# Patient Record
Sex: Female | Born: 1995
Health system: Southern US, Community
[De-identification: ages and names within clinical notes are randomized; demographics above are authoritative.]

## PROBLEM LIST (undated history)

## (undated) DIAGNOSIS — E559 Vitamin D deficiency, unspecified: Secondary | ICD-10-CM

## (undated) HISTORY — DX: Vitamin D deficiency, unspecified: E55.9

---
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
## 2016-03-06 ENCOUNTER — Ambulatory Visit (INDEPENDENT_AMBULATORY_CARE_PROVIDER_SITE_OTHER): Payer: 59 | Admitting: Medical

## 2016-03-06 ENCOUNTER — Encounter: Payer: Self-pay | Admitting: Medical

## 2016-03-06 VITALS — BP 102/60 | HR 76 | Resp 18 | Wt 164.0 lb

## 2016-03-06 DIAGNOSIS — N898 Other specified noninflammatory disorders of vagina: Secondary | ICD-10-CM | POA: Diagnosis not present

## 2016-03-06 DIAGNOSIS — R109 Unspecified abdominal pain: Secondary | ICD-10-CM | POA: Diagnosis not present

## 2016-03-06 LAB — POCT URINALYSIS DIPSTICK
BILIRUBIN UA: NEGATIVE
Blood, UA: NEGATIVE
Glucose, UA: NEGATIVE
KETONES UA: NEGATIVE
LEUKOCYTES UA: NEGATIVE
Nitrite, UA: NEGATIVE
PH UA: 6
Protein, UA: NEGATIVE
Urobilinogen, UA: NEGATIVE

## 2016-03-06 LAB — POCT WET PREP (WET MOUNT)

## 2016-03-06 MED ORDER — FLUCONAZOLE 150 MG PO TABS: ORAL_TABLET | ORAL | 1 refills | Status: DC

## 2016-03-06 NOTE — Progress Notes (Signed)
Subjective: Chief Complaint  Patient presents with  . Vaginal Discharge    onset 1 week ago. reports thick, white discharge. Reports after having intercourse has a smell (fishy). Pt is having irritation.    Here for 1 wk hx/ o thick white vaginal discharge.  Has a fishy odor after intercourse.   Having some vaginal irritation.  She notes new sexual partner x 53mo but uses condoms always.  No recent antibiotics.  No change in hygiene products.   She does note some upper abdomian discomfort at times, in the mornings.   Not necessarily after fried foods/fatty foods.   Eats 1-2 meals daily, typically fruit or pop tart at breakfast.   Does eat fast food, drinks a fair amount of soda.  No much protein.  No recent fever, no urinary c/o, no bowel c/o, no diarrhea, no constipation, no blood in urine or stool.  No other aggravating or relieving factors. No other complaint.  Past Medical History:  Diagnosis Date  . Vitamin D deficiency    No current outpatient prescriptions on file prior to visit.   No current facility-administered medications on file prior to visit.    ROS as in subjective   Objective: BP 102/60   Pulse 76   Resp 18   Wt 164 lb (74.4 kg)   LMP 02/23/2016   BMI 30.00 kg/m   Wt Readings from Last 3 Encounters:  03/06/16 164 lb (74.4 kg) (90 %, Z= 1.27)*  11/17/15 159 lb (72.1 kg) (88 %, Z= 1.17)*  07/08/15 170 lb (77.1 kg) (93 %, Z= 1.46)*   * Growth percentiles are based on CDC 2-20 Years data.   Gen: wd, wn, nad Gyn: Normal external genitalia without lesions, vagina with normal mucosa, cervix without lesions, no cervical motion tenderness, no abnormal vaginal discharge.  Uterus and adnexa not enlarged, nontender, no masses.  Swabs taken.  Exam chaperoned by nurse.     Assessment: Encounter Diagnoses  Name Primary?  . Vaginal discharge Yes  . Abdominal discomfort      Plan Advised less sugar in diet, healthier diet, try and get protein/carb combo with meals.    If abdominal pain continues, then f/u  Vaginal discharge - wet prep + yeast.  Begin diflucan, f/u pending other labs.   C/t condom use, safe sex.   Miranda Schneider was seen today for vaginal discharge.  Diagnoses and all orders for this visit:  Vaginal discharge -     Urinalysis Dipstick -     GC/Chlamydia Probe Amp -     POCT Wet Prep [facility])  Abdominal discomfort  Other orders -     fluconazole (DIFLUCAN) 150 MG tablet; 1 tablet every other day

## 2016-03-07 LAB — GC/CHLAMYDIA PROBE AMP
CT Probe RNA: NOT DETECTED
GC Probe RNA: NOT DETECTED

## 2016-05-16 ENCOUNTER — Ambulatory Visit: Payer: 59 | Admitting: Medical

## 2016-05-18 ENCOUNTER — Encounter: Payer: Self-pay | Admitting: Medical

## 2016-06-23 ENCOUNTER — Ambulatory Visit (HOSPITAL_COMMUNITY)
Admission: EM | Admit: 2016-06-23 | Discharge: 2016-06-23 | Disposition: A | Discharge status: Other Acute Inpatient Hospital | Payer: Self-pay

## 2016-06-24 ENCOUNTER — Encounter (HOSPITAL_COMMUNITY): Payer: Self-pay | Admitting: Emergency Medicine

## 2016-06-24 ENCOUNTER — Ambulatory Visit (HOSPITAL_COMMUNITY)
Admission: EM | Admit: 2016-06-24 | Discharge: 2016-06-24 | Disposition: A | Discharge status: Home / Self Care | Payer: 59 | Attending: Internal Medicine | Admitting: Internal Medicine

## 2016-06-24 DIAGNOSIS — R05 Cough: Secondary | ICD-10-CM | POA: Insufficient documentation

## 2016-06-24 DIAGNOSIS — J029 Acute pharyngitis, unspecified: Secondary | ICD-10-CM | POA: Insufficient documentation

## 2016-06-24 DIAGNOSIS — E559 Vitamin D deficiency, unspecified: Secondary | ICD-10-CM | POA: Insufficient documentation

## 2016-06-24 DIAGNOSIS — R509 Fever, unspecified: Secondary | ICD-10-CM | POA: Insufficient documentation

## 2016-06-24 DIAGNOSIS — R6889 Other general symptoms and signs: Secondary | ICD-10-CM | POA: Diagnosis not present

## 2016-06-24 LAB — POCT RAPID STREP A: STREPTOCOCCUS, GROUP A SCREEN (DIRECT): NEGATIVE

## 2016-06-24 MED ORDER — OSELTAMIVIR PHOSPHATE 75 MG PO CAPS: 75.0000 mg | ORAL_CAPSULE | Freq: Two times a day (BID) | ORAL | 0 refills | Status: DC

## 2016-06-24 NOTE — Discharge Instructions (Signed)
Rest, tylenol for comfort.  Lots of fluids.  No return to school or work until 24 hours symptom free.

## 2016-06-24 NOTE — ED Provider Notes (Signed)
CSN: [id number]     Arrival date & time 06/24/16  1516 History   First MD Initiated Contact with Patient 06/24/16 1754     Chief Complaint  Patient presents with  . Sore Throat   (Consider location/radiation/quality/duration/timing/severity/associated sxs/prior Treatment)  HPI   The patient is a 21 year old female presenting today with complaints of generalized malaise with fever, cough, sore throat, headache, and muscle aching since Thursday evening/Friday morning.  Patient states she has a productive green mucus with cough. Patient reports positive fever and chills with some nausea but no vomiting. The patient states, "I ache all over".  Past Medical History:  Diagnosis Date  . Vitamin D deficiency    History reviewed. No pertinent surgical history. Family History  Problem Relation Age of Onset  . Heart disease Neg Hx   . Stroke Neg Hx   . Diabetes Neg Hx   . Cancer Neg Hx   . Hypertension Paternal Grandmother    Social History  Substance Use Topics  . Smoking status: Never Smoker  . Smokeless tobacco: Not on file  . Alcohol use No   OB History    No data available     Review of Systems  Constitutional: Positive for chills, fatigue and fever.  HENT: Positive for congestion and sore throat.   Eyes: Negative.  Negative for visual disturbance.  Respiratory: Positive for cough. Negative for shortness of breath.   Cardiovascular: Negative.  Negative for chest pain and leg swelling.  Gastrointestinal: Positive for nausea. Negative for diarrhea and vomiting.  Endocrine: Negative.   Genitourinary: Negative.   Musculoskeletal: Negative.  Negative for gait problem and neck stiffness.  Skin: Negative.  Negative for color change and rash.  Allergic/Immunologic: Positive for environmental allergies. Negative for food allergies and immunocompromised state.  Neurological: Positive for headaches. Negative for dizziness and weakness.  Hematological: Negative.    Psychiatric/Behavioral: Negative.     Allergies  Patient has no known allergies.  Home Medications   Prior to Admission medications   Medication Sig Start Date End Date Taking? Authorizing Provider  oseltamivir (TAMIFLU) 75 MG capsule Take 1 capsule (75 mg total) by mouth every 12 (twelve) hours. 06/24/16   Servando Salinaatherine H Ozelle Brubacher, NP   Meds Ordered and Administered this Visit  Medications - No data to display  BP 133/57 (BP Location: Right Arm)   Pulse 69   Temp 98.3 F (36.8 C) (Oral)   Resp 18   SpO2 98%  No data found.   Physical Exam  Constitutional: She is oriented to person, place, and time. She appears well-developed and well-nourished. No distress.  Eyes: Conjunctivae are normal. Pupils are equal, round, and reactive to light. Right eye exhibits no discharge. Left eye exhibits no discharge. No scleral icterus.  Neck: Normal range of motion. Neck supple. No thyromegaly present.  Needed for nuchal rigidity  Cardiovascular: Normal rate, regular rhythm, normal heart sounds and intact distal pulses.  Exam reveals no gallop and no friction rub.   No murmur heard. Pulmonary/Chest: Effort normal and breath sounds normal. No respiratory distress. She has no wheezes. She has no rales. She exhibits no tenderness.  Lymphadenopathy:    She has no cervical adenopathy.  Neurological: She is alert and oriented to person, place, and time.  Skin: Skin is warm and dry. No rash noted. She is not diaphoretic.  Nursing note and vitals reviewed.   Urgent Care Course     Procedures (including critical care time)  Labs Review Labs Reviewed  POCT RAPID STREP A    Imaging Review No results found.  Results for orders placed or performed during the hospital encounter of 06/24/16  POCT rapid strep A [facility])  Result Value Ref Range   Streptococcus, Group A Screen (Direct) NEGATIVE NEGATIVE   Is most likely a flulike illness. Patient requests work and school note.   MDM    1. Flu-like symptoms    Meds ordered this encounter  Medications  . oseltamivir (TAMIFLU) 75 MG capsule    Sig: Take 1 capsule (75 mg total) by mouth every 12 (twelve) hours.    Dispense:  10 capsule    Refill:  0   The usual and customary discharge instructions and warnings were given.  The patient verbalizes understanding and agrees to plan of care.      Servando Salina, NP 06/24/16 2010

## 2016-06-24 NOTE — ED Triage Notes (Signed)
The patient presented to the [facility] with a complaint of a sore throat x 4 days. The patient denied any fever at home. 

## 2016-06-27 LAB — CULTURE, GROUP A STREP (THRC)

## 2018-10-11 ENCOUNTER — Ambulatory Visit (HOSPITAL_COMMUNITY)
Admission: EM | Admit: 2018-10-11 | Discharge: 2018-10-11 | Disposition: A | Discharge status: Home / Self Care | Payer: 59 | Attending: Family Medicine | Admitting: Family Medicine

## 2018-10-11 ENCOUNTER — Other Ambulatory Visit: Payer: Self-pay

## 2018-10-11 DIAGNOSIS — N760 Acute vaginitis: Secondary | ICD-10-CM

## 2018-10-11 MED ORDER — FLUCONAZOLE 150 MG PO TABS: 150.0000 mg | ORAL_TABLET | Freq: Once | ORAL | 10 refills | Status: AC

## 2018-10-11 NOTE — ED Provider Notes (Signed)
[facility]    CSN: [id number] Arrival date & time: 10/11/18  1339     History   Chief Complaint Chief Complaint  Patient presents with  . Vaginal Itching    discharge    HPI Miranda Schneider is a 23 y.o. female.   Established [facility] patient with vaginal discharge following abx.  Per pt she has been on antibiotics for about 1 month for different things and now has itching and discharge and very irritated. Swollen in the vagina area. Very thick tissue like stuff coming out of her vagina.  She has a new partner and would like STD testing     Past Medical History:  Diagnosis Date  . Vitamin D deficiency     Patient Active Problem List   Diagnosis Date Noted  . Abdominal discomfort 03/06/2016  . Vaginal discharge 03/06/2016  . Shoulder subluxation, right 03/26/2011    No past surgical history on file.  OB History   No obstetric history on file.      Home Medications    Prior to Admission medications   Medication Sig Start Date End Date Taking? Authorizing Provider  fluconazole (DIFLUCAN) 150 MG tablet Take 1 tablet (150 mg total) by mouth once for 1 dose. Repeat if needed 10/11/18 10/11/18  Elvina Sidle, MD    Family History Family History  Problem Relation Age of Onset  . Heart disease Neg Hx   . Stroke Neg Hx   . Diabetes Neg Hx   . Cancer Neg Hx   . Hypertension Paternal Grandmother     Social History Social History   Tobacco Use  . Smoking status: Never Smoker  Substance Use Topics  . Alcohol use: No  . Drug use: No     Allergies   Patient has no known allergies.   Review of Systems Review of Systems   Physical Exam Triage Vital Signs ED Triage Vitals [10/11/18 1419]  Enc Vitals Group     BP      Pulse      Resp      Temp      Temp src      SpO2      Weight      Height      Head Circumference      Peak Flow      Pain Score 5     Pain Loc      Pain Edu?      Excl. in GC?    No data found.  Updated Vital  Signs BP 137/90 (BP Location: Right Arm)   Pulse 83   Temp 98.6 F (37 C) (Oral)   Resp 16   LMP 10/03/2018   SpO2 99%   Physical Exam Vitals signs and nursing note reviewed.  Constitutional:      Appearance: Normal appearance.  HENT:     Head: Normocephalic.  Eyes:     Conjunctiva/sclera: Conjunctivae normal.  Neck:     Musculoskeletal: Normal range of motion and neck supple.  Pulmonary:     Effort: Pulmonary effort is normal.  Musculoskeletal: Normal range of motion.  Skin:    General: Skin is warm and dry.  Neurological:     General: No focal deficit present.     Mental Status: She is alert.  Psychiatric:        Mood and Affect: Mood normal.        Thought Content: Thought content normal.  Judgment: Judgment normal.      UC Treatments / Results  Labs (all labs ordered are listed, but only abnormal results are displayed) Labs Reviewed  URINE CYTOLOGY ANCILLARY ONLY    EKG None  Radiology No results found.  Procedures Procedures (including critical care time)  Medications Ordered in UC Medications - No data to display  Initial Impression / Assessment and Plan / UC Course  I have reviewed the triage vital signs and the nursing notes.  Pertinent labs & imaging results that were available during my care of the patient were reviewed by me and considered in my medical decision making (see chart for details).    Final Clinical Impressions(s) / UC Diagnoses   Final diagnoses:  Vaginitis and vulvovaginitis   Discharge Instructions   None    ED Prescriptions    Medication Sig Dispense Auth. Provider   fluconazole (DIFLUCAN) 150 MG tablet Take 1 tablet (150 mg total) by mouth once for 1 dose. Repeat if needed 2 tablet Elvina SidleLauenstein, Christionna Poland, MD     Controlled Substance Prescriptions [place] Controlled Substance Registry consulted? Not Applicable   Elvina SidleLauenstein, Nomie Buchberger, MD 10/11/18 [phone]

## 2018-10-11 NOTE — ED Triage Notes (Signed)
Per pt she has been on antibiotics for about 1 month for different things and now has itching and discharge and very irritated. Swollen in the vagina area. Very thick tissue like stuff coming out of her vagina.

## 2018-10-13 LAB — URINE CYTOLOGY ANCILLARY ONLY
Chlamydia: NEGATIVE
Neisseria Gonorrhea: NEGATIVE
Trichomonas: NEGATIVE

## 2018-10-14 LAB — URINE CYTOLOGY ANCILLARY ONLY: Candida vaginitis: POSITIVE — AB

## 2018-10-20 ENCOUNTER — Telehealth (HOSPITAL_COMMUNITY): Payer: Self-pay | Admitting: Emergency Medicine

## 2018-10-20 MED ORDER — METRONIDAZOLE 500 MG PO TABS: 500.0000 mg | ORAL_TABLET | Freq: Two times a day (BID) | ORAL | 0 refills | Status: AC

## 2018-10-20 NOTE — Telephone Encounter (Signed)
Candida (yeast) is positive.  Prescription for fluconazole was given at the urgent care visit.    Pt states she is still having symptoms, requesting something for BV. Okay to send per Dr. Mannie Stabile, pt will need to be reseen if symptoms are still present after finishing medicines. Pt agreeable to plan, all questions answered.

## 2018-12-02 ENCOUNTER — Ambulatory Visit (INDEPENDENT_AMBULATORY_CARE_PROVIDER_SITE_OTHER)
Admission: RE | Admit: 2018-12-02 | Discharge: 2018-12-02 | Disposition: A | Discharge status: Home / Self Care | Payer: 59 | Source: Ambulatory Visit

## 2018-12-02 ENCOUNTER — Telehealth: Payer: 59

## 2018-12-02 ENCOUNTER — Ambulatory Visit
Admission: RE | Admit: 2018-12-02 | Discharge: 2018-12-02 | Discharge status: Against Medical Advice | Payer: 59 | Source: Ambulatory Visit

## 2018-12-02 DIAGNOSIS — N898 Other specified noninflammatory disorders of vagina: Secondary | ICD-10-CM | POA: Diagnosis not present

## 2018-12-02 MED ORDER — FLUCONAZOLE 200 MG PO TABS: ORAL_TABLET | ORAL | 0 refills | Status: DC

## 2018-12-02 MED ORDER — METRONIDAZOLE 500 MG PO TABS: 500.0000 mg | ORAL_TABLET | Freq: Two times a day (BID) | ORAL | 0 refills | Status: DC

## 2018-12-02 NOTE — Discharge Instructions (Signed)
Prescribed metronidazole 500 mg twice daily for 7 days (do not take while consuming alcohol and/or if breastfeeding) °Prescribed diflucan 200 mg once daily and then second dose 72 hours later °Take medications as prescribed and to completion °Follow up in person or with PCP if symptoms persists °Follow up in person or go to ER if you have any new or worsening symptoms fever, chills, nausea, vomiting, abdominal or pelvic pain, painful intercourse, vaginal discharge, vaginal bleeding, persistent symptoms despite treatment, etc... °

## 2018-12-02 NOTE — ED Provider Notes (Signed)
[facility] Virtual Visit via Video Note:  Miranda Schneider  initiated request for Telemedicine visit with [facility]gent [facility] team. I connected with Miranda Schneider  on 12/02/2018 at 11:33 AM  for a synchronized telemedicine visit using a video enabled HIPPA compliant telemedicine application. I verified that I am speaking with Miranda Schneider  using two identifiers. Miranda Box, PA-C  was physically located in a [facility] Urgent care site and Miranda Schneider was located at a different location.   The limitations of evaluation and management by telemedicine as well as the availability of in-person appointments were discussed. Patient was informed that she  may incur a bill ( including co-pay) for this virtual visit encounter. Miranda Schneider  expressed understanding and gave verbal consent to proceed with virtual visit.  [id number] 12/02/18 Arrival Time: 1120   [id number] DISCHARGE  SUBJECTIVE:  Miranda Schneider is a 23 y.o. female who presents with complaints of vaginal discharge and odor that began 2-3 days ago.  States partner ejaculated during intercourse prior to symptoms.  Patient is sexually active with 1 female partner over the past 6 months.  Describes discharge as milky and between a thick and thin consistency.  She has tried OTC medications without relief.  She reports reoccurring symptoms when her partner ejaculates during intercourse.  She reports similar symptoms in the past and was diagnosed with BV.  Complains of slight itching.  She denies fever, chills, nausea, vomiting, abdominal or pelvic pain, urinary symptoms, vaginal bleeding, dyspareunia, vaginal rashes or lesions.   LMP 11/07/2018 Current birth control method: None  ROS: As per HPI.  All other pertinent ROS negative.     Past Medical History:  Diagnosis Date  . Vitamin D deficiency    No past surgical history on file. No Known Allergies No current facility-administered medications on file prior  to encounter.    No current outpatient medications on file prior to encounter.     OBJECTIVE:  There were no vitals filed for this visit.  General appearance: alert; no distress Eyes: EOMI grossly HENT: normocephalic; atraumatic Neck: supple with FROM Lungs: normal respiratory effort; speaking in full sentences without difficulty Extremities: moves extremities without difficulty Skin: No obvious rashes Neurologic: No facial asymmetries Psychological: alert and cooperative; normal mood and affect  ASSESSMENT & PLAN:  1. Vaginal discharge   2. Vaginal odor     Meds ordered this encounter  Medications  . metroNIDAZOLE (FLAGYL) 500 MG tablet    Sig: Take 1 tablet (500 mg total) by mouth 2 (two) times daily.    Dispense:  14 tablet    Refill:  0    Order Specific Question:   Supervising Provider    Answer:   Raylene Everts [[id number]]  . fluconazole (DIFLUCAN) 200 MG tablet    Sig: Take one dose by mouth, wait 72 hours, and then take second dose by mouth    Dispense:  2 tablet    Refill:  0    Order Specific Question:   Supervising Provider    Answer:   Raylene Everts [[id number]]   Prescribed metronidazole 500 mg twice daily for 7 days (do not take while consuming alcohol and/or if breastfeeding) Prescribed diflucan 200 mg once daily and then second dose 72 hours later Take medications as prescribed and to completion Follow up in person or with PCP if symptoms persists Follow up in person or go to ER if you have any new or  worsening symptoms fever, chills, nausea, vomiting, abdominal or pelvic pain, painful intercourse, vaginal discharge, vaginal bleeding, persistent symptoms despite treatment, etc...  I discussed the assessment and treatment plan with the patient. The patient was provided an opportunity to ask questions and all were answered. The patient agreed with the plan and demonstrated an understanding of the instructions.   The patient was advised to call back or  seek an in-person evaluation if the symptoms worsen or if the condition fails to improve as anticipated.  I provided 15 minutes of non-face-to-face time during this encounter.  Rennis HardingBrittany Julias Mould, PA-C  12/02/2018 11:33 AM       Rennis HardingWurst, Jeremy Ditullio, PA-C 12/02/18 1137

## 2018-12-15 ENCOUNTER — Ambulatory Visit (INDEPENDENT_AMBULATORY_CARE_PROVIDER_SITE_OTHER)
Admission: RE | Admit: 2018-12-15 | Discharge: 2018-12-15 | Disposition: A | Discharge status: Home / Self Care | Payer: 59 | Source: Ambulatory Visit

## 2018-12-15 DIAGNOSIS — R110 Nausea: Secondary | ICD-10-CM

## 2018-12-15 DIAGNOSIS — R1010 Upper abdominal pain, unspecified: Secondary | ICD-10-CM | POA: Diagnosis not present

## 2018-12-15 MED ORDER — OMEPRAZOLE 20 MG PO CPDR: 20.0000 mg | DELAYED_RELEASE_CAPSULE | Freq: Every day | ORAL | 0 refills | Status: DC

## 2018-12-15 NOTE — ED Provider Notes (Signed)
Virtual Visit via Video Note:  Miranda Schneider  initiated request for Telemedicine visit with [facility] Urgent Care team. I connected with Miranda Schneider  on 12/15/2018 at 4:28 PM  for a synchronized telemedicine visit using a video enabled HIPPA compliant telemedicine application. I verified that I am speaking with Miranda Schneider  using two identifiers. Zigmund Gottron, NP  was physically located in a [facility] Urgent care site and Miranda Schneider was located at a different location.   The limitations of evaluation and management by telemedicine as well as the availability of in-person appointments were discussed. Patient was informed that she  may incur a bill ( including co-pay) for this virtual visit encounter. Miranda Schneider  expressed understanding and gave verbal consent to proceed with virtual visit.     History of Present Illness:Miranda Schneider  is a 23 y.o. female presents with complaints of upper abdominal pain and nausea. No vomiting. Has had this for the past two weeks. All the way across the upper abdomen. Nothing seems to worsen the symptoms. Laying down and when she wakes the pain is worse. Uncomfortable, not sharp or cramping. No increased pain with eating. Denies any previous evaluation for this. No fevers. Denies constipation. Regular bm's. No palpitations, heartracing etc. No shortness of breath or cough. Pain doesn't radiate up to throat or chest. A few days ago she woke up and had a burp in which some emesis came up and it felt burning. Hasn't taken any medications for symptoms. No previous abdominal surgeries. She is still taking antibiotics for vaginitis, but symptoms have improved.   Past Medical History:  Diagnosis Date  . Vitamin D deficiency     No Known Allergies      Observations/Objective: Alert, oriented, non toxic in appearance. Clear coherent speech without difficulty. No increased work of breathing visualized.  Indicates generalized upper abdominal  pain as location of pain, all the way across  Assessment and Plan: gerd vs cholecystitis vs pud vs gastritis considered. gerd treatment and lifestyle modifications recommended initially with in person visit recommended if no improvement. Return precautions provided. Patient verbalized understanding and agreeable to plan.    Follow Up Instructions:    I discussed the assessment and treatment plan with the patient. The patient was provided an opportunity to ask questions and all were answered. The patient agreed with the plan and demonstrated an understanding of the instructions.   The patient was advised to call back or seek an in-person evaluation if the symptoms worsen or if the condition fails to improve as anticipated.  I provided 13 minutes of non-face-to-face time during this encounter.    Zigmund Gottron, NP  12/15/2018 4:28 PM         Zigmund Gottron, NP 12/15/18 1630

## 2018-12-15 NOTE — Discharge Instructions (Signed)
See provided information about lifestyle modifications and diet modifications you can try to help with your symptoms.  Start daily omeprazole.  If no improvement in the next month please follow up with your primary care provider.

## 2019-06-02 ENCOUNTER — Ambulatory Visit: Payer: 59 | Attending: Internal Medicine

## 2019-06-02 DIAGNOSIS — Z20822 Contact with and (suspected) exposure to covid-19: Secondary | ICD-10-CM

## 2019-06-03 ENCOUNTER — Other Ambulatory Visit: Payer: 59

## 2019-06-03 LAB — NOVEL CORONAVIRUS, NAA: SARS-CoV-2, NAA: NOT DETECTED

## 2019-06-08 ENCOUNTER — Ambulatory Visit: Payer: 59 | Attending: Internal Medicine

## 2019-06-08 DIAGNOSIS — Z20822 Contact with and (suspected) exposure to covid-19: Secondary | ICD-10-CM

## 2019-06-09 LAB — NOVEL CORONAVIRUS, NAA: SARS-CoV-2, NAA: NOT DETECTED

## 2019-07-01 ENCOUNTER — Encounter (HOSPITAL_COMMUNITY): Payer: Self-pay

## 2019-07-01 ENCOUNTER — Ambulatory Visit (HOSPITAL_COMMUNITY)
Admission: EM | Admit: 2019-07-01 | Discharge: 2019-07-01 | Disposition: A | Discharge status: Home / Self Care | Payer: 59

## 2019-07-01 ENCOUNTER — Other Ambulatory Visit: Payer: Self-pay

## 2019-07-01 DIAGNOSIS — Z3A15 15 weeks gestation of pregnancy: Secondary | ICD-10-CM

## 2019-07-01 DIAGNOSIS — N898 Other specified noninflammatory disorders of vagina: Secondary | ICD-10-CM

## 2019-07-01 DIAGNOSIS — Z8619 Personal history of other infectious and parasitic diseases: Secondary | ICD-10-CM

## 2019-07-01 LAB — POCT URINALYSIS DIP (DEVICE)
Bilirubin Urine: NEGATIVE
Glucose, UA: NEGATIVE mg/dL
Hgb urine dipstick: NEGATIVE
Ketones, ur: NEGATIVE mg/dL
Nitrite: NEGATIVE
Protein, ur: NEGATIVE mg/dL
Specific Gravity, Urine: >=1.03 (ref 1.005–1.030)
Urobilinogen, UA: 0.2 mg/dL (ref 0.0–1.0)
pH: 6.5 (ref 5.0–8.0)

## 2019-07-01 MED ORDER — TERCONAZOLE 0.8 % VA CREA: 1.0000 | TOPICAL_CREAM | Freq: Every day | VAGINAL | 0 refills | Status: DC

## 2019-07-01 NOTE — ED Triage Notes (Addendum)
Pt presents to UC with white vaginal discharge x 12 days. Pt states she started having white vaginal discharged after been treat it for Chlamydia and Gonorrhea. Pt states she is [redacted] weeks pregnant.

## 2019-07-01 NOTE — ED Provider Notes (Signed)
  [facility]   MRN: [id number] DOB: 11/08/1995  Subjective:   Miranda Schneider is a 24 y.o. female presenting for 12-day history of persistent vaginal discharge.  She has also had some intermittent swelling and irritation.  Of note, patient did get treated for chlamydia and gonorrhea 12 days ago.  Has a history of yeast infections when she takes antibiotics.  She is [redacted] weeks pregnant.  Takes prenatal vitamins.  No Known Allergies  Past Medical History:  Diagnosis Date  . Vitamin D deficiency      History reviewed. No pertinent surgical history.  Family History  Problem Relation Age of Onset  . Hypertension Paternal Grandmother   . Heart disease Neg Hx   . Stroke Neg Hx   . Diabetes Neg Hx   . Cancer Neg Hx     Social History   Tobacco Use  . Smoking status: Never Smoker  . Smokeless tobacco: Never Used  Substance Use Topics  . Alcohol use: No  . Drug use: No    ROS Denies fever, genital rash, abdominal pain, pelvic pain.  Objective:   Vitals: BP 108/69 (BP Location: Right Arm)   Pulse 87   Temp 98.5 F (36.9 C) (Oral)   Resp 17   LMP 10/03/2018   SpO2 100%   Physical Exam Constitutional:      General: She is not in acute distress.    Appearance: Normal appearance. She is well-developed. She is not ill-appearing, toxic-appearing or diaphoretic.  HENT:     Head: Normocephalic and atraumatic.     Nose: Nose normal.     Mouth/Throat:     Mouth: Mucous membranes are moist.     Pharynx: Oropharynx is clear.  Eyes:     General: No scleral icterus.    Extraocular Movements: Extraocular movements intact.     Pupils: Pupils are equal, round, and reactive to light.  Cardiovascular:     Rate and Rhythm: Normal rate.  Pulmonary:     Effort: Pulmonary effort is normal.  Skin:    General: Skin is warm and dry.  Neurological:     General: No focal deficit present.     Mental Status: She is alert and oriented to person, place, and time.   Psychiatric:        Mood and Affect: Mood normal.        Behavior: Behavior normal.        Thought Content: Thought content normal.        Judgment: Judgment normal.      Assessment and Plan :   1. Vaginal discharge   2. [redacted] weeks gestation of pregnancy   3. History of chlamydia   4. History of gonorrhea     Start topical terconazole to treat empirically for yeast vaginitis in pregnancy.  Labs pending for recheck of gonorrhea, chlamydia and also BV and yeast.  Keep close follow-up with obstetrician. Counseled patient on potential for adverse effects with medications prescribed/recommended today, ER and return-to-clinic precautions discussed, patient verbalized understanding.    Wallis Bamberg, [place] 07/01/19 [phone]

## 2019-07-03 LAB — CERVICOVAGINAL ANCILLARY ONLY
Bacterial vaginitis: NEGATIVE
Candida vaginitis: POSITIVE — AB
Chlamydia: NEGATIVE
Neisseria Gonorrhea: NEGATIVE
Trichomonas: NEGATIVE

## 2019-07-09 DIAGNOSIS — F17200 Nicotine dependence, unspecified, uncomplicated: Secondary | ICD-10-CM | POA: Insufficient documentation

## 2020-03-04 ENCOUNTER — Ambulatory Visit
Admission: EM | Admit: 2020-03-04 | Discharge: 2020-03-04 | Disposition: A | Discharge status: Home / Self Care | Payer: 59 | Attending: Emergency Medicine | Admitting: Emergency Medicine

## 2020-03-04 DIAGNOSIS — B373 Candidiasis of vulva and vagina: Secondary | ICD-10-CM | POA: Insufficient documentation

## 2020-03-04 DIAGNOSIS — B3731 Acute candidiasis of vulva and vagina: Secondary | ICD-10-CM

## 2020-03-04 LAB — POCT URINE PREGNANCY: Preg Test, Ur: NEGATIVE

## 2020-03-04 MED ORDER — FLUCONAZOLE 150 MG PO TABS: 150.0000 mg | ORAL_TABLET | Freq: Every day | ORAL | 0 refills | Status: DC

## 2020-03-04 NOTE — Discharge Instructions (Addendum)
Today you received treatment for yeast. Testing for chlamydia, gonorrhea, trichomonas is pending: please look for these results on the MyChart app/website.  We will notify you if you are positive and outline treatment at that time.  Important to avoid all forms of sexual intercourse (oral, vaginal, anal) with any/all partners for the next 7 days to avoid spreading/reinfecting. Any/all sexual partners should be notified of testing/treatment today.  Return for persistent/worsening symptoms or if you develop fever, abdominal or pelvic pain, discharge, genital pain, blood in your urine, or are re-exposed to an STI. 

## 2020-03-04 NOTE — ED Provider Notes (Signed)
[name]    CSN: [id number] Arrival date & time: 03/04/20  1041      History   Chief Complaint Chief Complaint  Patient presents with  . Vaginal Discharge    HPI Miranda Schneider is a 24 y.o. female  Presenting for vaginal discharge intermittently for the last week or so. States that she was treated for BV, which she has a history of, 9/27. Did not take medications appropriately due to misplacing them for a few days. States that she developed thick, white discharge with vaginal pruritus shortly thereafter. Endorsing history of vaginal Candida s/p antibiotic use. Currently sexual active with 1 female partner, not routinely using condoms. Does have a newborn at home: Not breast-feeding. Denies urinary frequency, urgency, burning, hematuria. No abdominal, pelvic or back pain, fever.  Past Medical History:  Diagnosis Date  . Vitamin D deficiency     Patient Active Problem List   Diagnosis Date Noted  . Abdominal discomfort 03/06/2016  . Vaginal discharge 03/06/2016  . Shoulder subluxation, right 03/26/2011    History reviewed. No pertinent surgical history.  OB History    Gravida  1   Para      Term      Preterm      AB      Living        SAB      TAB      Ectopic      Multiple      Live Births               Home Medications    Prior to Admission medications   Medication Sig Start Date End Date Taking? Authorizing Provider  fluconazole (DIFLUCAN) 150 MG tablet Take 1 tablet (150 mg total) by mouth daily. May repeat in 72 hours if needed 03/04/20   Hall-Potvin, [place], PA-C  omeprazole (PRILOSEC) 20 MG capsule Take 1 capsule (20 mg total) by mouth daily. 12/15/18 07/01/19  Georgetta Haber, NP    Family History Family History  Problem Relation Age of Onset  . Hypertension Paternal Grandmother   . Heart disease Neg Hx   . Stroke Neg Hx   . Diabetes Neg Hx   . Cancer Neg Hx     Social History Social History   Tobacco Use  .  Smoking status: Never Smoker  . Smokeless tobacco: Never Used  Substance Use Topics  . Alcohol use: No  . Drug use: No     Allergies   Patient has no known allergies.   Review of Systems As per HPI   Physical Exam Triage Vital Signs ED Triage Vitals  Enc Vitals Group     BP      Pulse      Resp      Temp      Temp src      SpO2      Weight      Height      Head Circumference      Peak Flow      Pain Score      Pain Loc      Pain Edu?      Excl. in GC?    No data found.  Updated Vital Signs BP (!) 141/92 (BP Location: Left Arm)   Pulse 77   Temp 98.7 F (37.1 C) (Oral)   Resp 18   LMP 02/02/2020   SpO2 96%   Breastfeeding No   Visual Acuity Right Eye Distance:  Left Eye Distance:   Bilateral Distance:    Right Eye Near:   Left Eye Near:    Bilateral Near:     Physical Exam Constitutional:      General: She is not in acute distress. HENT:     Head: Normocephalic and atraumatic.  Eyes:     General: No scleral icterus.    Pupils: Pupils are equal, round, and reactive to light.  Cardiovascular:     Rate and Rhythm: Normal rate.  Pulmonary:     Effort: Pulmonary effort is normal.  Abdominal:     General: Bowel sounds are normal.     Palpations: Abdomen is soft.     Tenderness: There is no abdominal tenderness. There is no right CVA tenderness, left CVA tenderness or guarding.  Genitourinary:    Comments: Patient declined, self-swab performed Skin:    Coloration: Skin is not jaundiced or pale.  Neurological:     Mental Status: She is alert and oriented to person, place, and time.      UC Treatments / Results  Labs (all labs ordered are listed, but only abnormal results are displayed) Labs Reviewed  POCT URINE PREGNANCY  CERVICOVAGINAL ANCILLARY ONLY    EKG   Radiology No results found.  Procedures Procedures (including critical care time)  Medications Ordered in UC Medications - No data to display  Initial Impression /  Assessment and Plan / UC Course  I have reviewed the triage vital signs and the nursing notes.  Pertinent labs & imaging results that were available during my care of the patient were reviewed by me and considered in my medical decision making (see chart for details).     Urine pregnancy negative. Cytology pending: We will treat empirically for vaginal Candida. Return precautions discussed, pt verbalized understanding and is agreeable to plan. Final Clinical Impressions(s) / UC Diagnoses   Final diagnoses:  Yeast vaginitis     Discharge Instructions     Today you received treatment for yeast. Testing for chlamydia, gonorrhea, trichomonas is pending: please look for these results on the MyChart app/website.  We will notify you if you are positive and outline treatment at that time.  Important to avoid all forms of sexual intercourse (oral, vaginal, anal) with any/all partners for the next 7 days to avoid spreading/reinfecting. Any/all sexual partners should be notified of testing/treatment today.  Return for persistent/worsening symptoms or if you develop fever, abdominal or pelvic pain, discharge, genital pain, blood in your urine, or are re-exposed to an STI.    ED Prescriptions    Medication Sig Dispense Auth. Provider   fluconazole (DIFLUCAN) 150 MG tablet Take 1 tablet (150 mg total) by mouth daily. May repeat in 72 hours if needed 2 tablet Hall-Potvin, [place], PA-C     PDMP not reviewed this encounter.   Hall-Potvin, [place], [place] 03/04/20 1105

## 2020-03-04 NOTE — ED Triage Notes (Addendum)
Pt states tx'd for a yeast infection 9/27 and didn't complete medication. States having white discharge with vaginal irritation, and burning/pain on urination at times. States wants STD testing and a pregnancy test.

## 2020-03-07 LAB — CERVICOVAGINAL ANCILLARY ONLY
Bacterial Vaginitis (gardnerella): NEGATIVE
Chlamydia: NEGATIVE
Comment: NEGATIVE
Comment: NEGATIVE
Comment: NEGATIVE
Comment: NORMAL
Neisseria Gonorrhea: NEGATIVE
Trichomonas: NEGATIVE

## 2020-04-08 ENCOUNTER — Other Ambulatory Visit: Payer: Self-pay

## 2020-04-08 ENCOUNTER — Ambulatory Visit
Admission: EM | Admit: 2020-04-08 | Discharge: 2020-04-08 | Disposition: A | Discharge status: Home / Self Care | Payer: 59 | Attending: Emergency Medicine | Admitting: Emergency Medicine

## 2020-04-08 DIAGNOSIS — N898 Other specified noninflammatory disorders of vagina: Secondary | ICD-10-CM | POA: Insufficient documentation

## 2020-04-08 LAB — POCT URINALYSIS DIP (MANUAL ENTRY)
Bilirubin, UA: NEGATIVE
Blood, UA: NEGATIVE
Glucose, UA: NEGATIVE mg/dL
Ketones, POC UA: NEGATIVE mg/dL
Leukocytes, UA: NEGATIVE
Nitrite, UA: NEGATIVE
Protein Ur, POC: NEGATIVE mg/dL
Spec Grav, UA: 1.025 (ref 1.010–1.025)
Urobilinogen, UA: 0.2 E.U./dL
pH, UA: 7 (ref 5.0–8.0)

## 2020-04-08 LAB — POCT URINE PREGNANCY: Preg Test, Ur: NEGATIVE

## 2020-04-08 NOTE — ED Triage Notes (Signed)
Pt present urinary frequency with a lot of pressure, abdominal pain and vaginal discharge. Symptoms started a week ago. Pt states she would like to be checked for an STD and pregnancy test.

## 2020-04-08 NOTE — ED Provider Notes (Signed)
[name]    CSN: [id number] Arrival date & time: 04/08/20  1041      History   Chief Complaint Chief Complaint  Patient presents with  . Urinary Tract Infection  . Abdominal Pain    HPI Miranda Schneider is a 24 y.o. female  Presenting for weeklong course of urinary frequency, suprapubic pressure, vaginal discharge.  Denies vaginal bleeding, pruritus, dysuria.  Denies polydipsia, polyphagia, back pain or fever.  Requesting STD and pregnancy testing.  States she has had a new partner since her last check which was negative.  Past Medical History:  Diagnosis Date  . Vitamin D deficiency     Patient Active Problem List   Diagnosis Date Noted  . Abdominal discomfort 03/06/2016  . Vaginal discharge 03/06/2016  . Shoulder subluxation, right 03/26/2011    History reviewed. No pertinent surgical history.  OB History    Gravida  1   Para      Term      Preterm      AB      Living        SAB      TAB      Ectopic      Multiple      Live Births               Home Medications    Prior to Admission medications   Medication Sig Start Date End Date Taking? Authorizing Provider  omeprazole (PRILOSEC) 20 MG capsule Take 1 capsule (20 mg total) by mouth daily. 12/15/18 07/01/19  Georgetta Haber, NP    Family History Family History  Problem Relation Age of Onset  . Hypertension Paternal Grandmother   . Heart disease Neg Hx   . Stroke Neg Hx   . Diabetes Neg Hx   . Cancer Neg Hx     Social History Social History   Tobacco Use  . Smoking status: Never Smoker  . Smokeless tobacco: Never Used  Substance Use Topics  . Alcohol use: No  . Drug use: No     Allergies   Patient has no known allergies.   Review of Systems Review of Systems  Constitutional: Negative for fatigue and fever.  Respiratory: Negative for cough and shortness of breath.   Cardiovascular: Negative for chest pain and palpitations.  Gastrointestinal:  Positive for abdominal pain. Negative for constipation and diarrhea.  Genitourinary: Positive for frequency and vaginal discharge. Negative for dysuria, flank pain, hematuria, pelvic pain, urgency, vaginal bleeding and vaginal pain.     Physical Exam Triage Vital Signs ED Triage Vitals  Enc Vitals Group     BP 04/08/20 1050 (!) 113/51     Pulse Rate 04/08/20 1050 82     Resp 04/08/20 1050 16     Temp 04/08/20 1050 97.9 F (36.6 C)     Temp Source 04/08/20 1050 Oral     SpO2 04/08/20 1050 97 %     Weight --      Height --      Head Circumference --      Peak Flow --      Pain Score 04/08/20 1051 0     Pain Loc --      Pain Edu? --      Excl. in GC? --    No data found.  Updated Vital Signs BP (!) 113/51 (BP Location: Left Arm)   Pulse 82   Temp 97.9 F (36.6 C) (Oral)   Resp  16   LMP 03/07/2020   SpO2 97%   Visual Acuity Right Eye Distance:   Left Eye Distance:   Bilateral Distance:    Right Eye Near:   Left Eye Near:    Bilateral Near:     Physical Exam Constitutional:      General: She is not in acute distress. HENT:     Head: Normocephalic and atraumatic.  Eyes:     General: No scleral icterus.    Pupils: Pupils are equal, round, and reactive to light.  Cardiovascular:     Rate and Rhythm: Normal rate.  Pulmonary:     Effort: Pulmonary effort is normal.  Abdominal:     General: Bowel sounds are normal.     Palpations: Abdomen is soft.     Tenderness: There is no abdominal tenderness. There is no right CVA tenderness, left CVA tenderness or guarding.  Genitourinary:    Comments: Patient declined, self-swab performed Skin:    Coloration: Skin is not jaundiced or pale.  Neurological:     Mental Status: She is alert and oriented to person, place, and time.      UC Treatments / Results  Labs (all labs ordered are listed, but only abnormal results are displayed) Labs Reviewed  POCT URINALYSIS DIP (MANUAL ENTRY) - Abnormal; Notable for the  following components:      Result Value   Clarity, UA hazy (*)    All other components within normal limits  POCT URINE PREGNANCY  CERVICOVAGINAL ANCILLARY ONLY    EKG   Radiology No results found.  Procedures Procedures (including critical care time)  Medications Ordered in UC Medications - No data to display  Initial Impression / Assessment and Plan / UC Course  I have reviewed the triage vital signs and the nursing notes.  Pertinent labs & imaging results that were available during my care of the patient were reviewed by me and considered in my medical decision making (see chart for details).     Urine unremarkable, urine pregnancy negative.  Cytology pending: Patient electing to wait for results prior to taking any medications.  States symptoms are mild to moderate currently.  Return precautions discussed, pt verbalized understanding and is agreeable to plan. Final Clinical Impressions(s) / UC Diagnoses   Final diagnoses:  Vaginal discharge     Discharge Instructions     Testing for chlamydia, gonorrhea, trichomonas is pending: please look for these results on the MyChart app/website.  We will notify you if you are positive and outline treatment at that time.  Important to avoid all forms of sexual intercourse (oral, vaginal, anal) with any/all partners for the next 7 days to avoid spreading/reinfecting. Any/all sexual partners should be notified of testing/treatment today.  Return for persistent/worsening symptoms or if you develop fever, abdominal or pelvic pain, discharge, genital pain, blood in your urine, or are re-exposed to an STI.    ED Prescriptions    None     PDMP not reviewed this encounter.   Hall-Potvin, [place], [place] 04/08/20 1223

## 2020-04-08 NOTE — Discharge Instructions (Signed)

## 2020-04-11 LAB — CERVICOVAGINAL ANCILLARY ONLY
Bacterial Vaginitis (gardnerella): NEGATIVE
Candida Glabrata: NEGATIVE
Candida Vaginitis: NEGATIVE
Chlamydia: NEGATIVE
Comment: NEGATIVE
Comment: NEGATIVE
Comment: NEGATIVE
Comment: NEGATIVE
Comment: NEGATIVE
Comment: NORMAL
Neisseria Gonorrhea: NEGATIVE
Trichomonas: NEGATIVE

## 2020-06-27 ENCOUNTER — Other Ambulatory Visit: Payer: Self-pay

## 2020-06-27 ENCOUNTER — Ambulatory Visit
Admission: EM | Admit: 2020-06-27 | Discharge: 2020-06-27 | Disposition: A | Discharge status: Home / Self Care | Payer: 59 | Attending: Physician Assistant | Admitting: Physician Assistant

## 2020-06-27 ENCOUNTER — Encounter: Payer: Self-pay | Admitting: Emergency Medicine

## 2020-06-27 DIAGNOSIS — B9689 Other specified bacterial agents as the cause of diseases classified elsewhere: Secondary | ICD-10-CM | POA: Diagnosis not present

## 2020-06-27 DIAGNOSIS — N760 Acute vaginitis: Secondary | ICD-10-CM | POA: Insufficient documentation

## 2020-06-27 MED ORDER — FLUCONAZOLE 150 MG PO TABS: 150.0000 mg | ORAL_TABLET | Freq: Every day | ORAL | 1 refills | Status: DC

## 2020-06-27 MED ORDER — METRONIDAZOLE 500 MG PO TABS: 500.0000 mg | ORAL_TABLET | Freq: Three times a day (TID) | ORAL | 0 refills | Status: AC

## 2020-06-27 NOTE — ED Triage Notes (Signed)
Pt states that she has vaginal discharge, itchiness, and vaginal odor. Pt states that she has lower abdominal pain but very mild pain.

## 2020-06-27 NOTE — ED Provider Notes (Signed)
[name]    CSN: [id number] Arrival date & time: 06/27/20  1245      History   Chief Complaint Chief Complaint  Patient presents with  . Vaginal Discharge    HPI Miranda Schneider is a 25 y.o. female.   The history is provided by the patient. No language interpreter was used.  Vaginal Discharge Quality:  White Severity:  Mild Onset quality:  Gradual Timing:  Constant Progression:  Worsening Chronicity:  New Relieved by:  Nothing Ineffective treatments:  None tried Associated symptoms: vaginal itching   Pt complains of vaginal discharge   Past Medical History:  Diagnosis Date  . Vitamin D deficiency     Patient Active Problem List   Diagnosis Date Noted  . Abdominal discomfort 03/06/2016  . Vaginal discharge 03/06/2016  . Shoulder subluxation, right 03/26/2011    History reviewed. No pertinent surgical history.  OB History    Gravida  1   Para      Term      Preterm      AB      Living        SAB      IAB      Ectopic      Multiple      Live Births               Home Medications    Prior to Admission medications   Medication Sig Start Date End Date Taking? Authorizing Provider  fluconazole (DIFLUCAN) 150 MG tablet Take 1 tablet (150 mg total) by mouth daily. 06/27/20  Yes Miranda Schneider K, PA-C  metroNIDAZOLE (FLAGYL) 500 MG tablet Take 1 tablet (500 mg total) by mouth 3 (three) times daily for 7 days. 06/27/20 07/04/20 Yes Miranda Areas, PA-C  omeprazole (PRILOSEC) 20 MG capsule Take 1 capsule (20 mg total) by mouth daily. 12/15/18 07/01/19  Miranda Haber, NP    Family History Family History  Problem Relation Age of Onset  . Hypertension Paternal Grandmother   . Heart disease Neg Hx   . Stroke Neg Hx   . Diabetes Neg Hx   . Cancer Neg Hx     Social History Social History   Tobacco Use  . Smoking status: Never Smoker  . Smokeless tobacco: Never Used  Substance Use Topics  . Alcohol use: No  . Drug use:  No     Allergies   Patient has no known allergies.   Review of Systems Review of Systems  Genitourinary: Positive for vaginal discharge.  All other systems reviewed and are negative.    Physical Exam Triage Vital Signs ED Triage Vitals  Enc Vitals Group     BP 06/27/20 1320 122/80     Pulse Rate 06/27/20 1320 88     Resp 06/27/20 1320 17     Temp 06/27/20 1320 98 F (36.7 C)     Temp Source 06/27/20 1320 Oral     SpO2 06/27/20 1320 96 %     Weight --      Height --      Head Circumference --      Peak Flow --      Pain Score 06/27/20 1318 0     Pain Loc --      Pain Edu? --      Excl. in GC? --    No data found.  Updated Vital Signs BP 122/80 (BP Location: Left Arm)   Pulse 88   Temp  98 F (36.7 C) (Oral)   Resp 17   LMP 05/25/2020 (Approximate)   SpO2 96%   Visual Acuity Right Eye Distance:   Left Eye Distance:   Bilateral Distance:    Right Eye Near:   Left Eye Near:    Bilateral Near:     Physical Exam Vitals and nursing note reviewed.  Constitutional:      Appearance: She is well-developed and well-nourished.  HENT:     Head: Normocephalic.  Eyes:     Extraocular Movements: EOM normal.  Cardiovascular:     Rate and Rhythm: Normal rate.  Pulmonary:     Effort: Pulmonary effort is normal.  Abdominal:     General: Abdomen is flat. There is no distension.  Musculoskeletal:        General: Normal range of motion.     Cervical back: Normal range of motion.  Skin:    General: Skin is warm.  Neurological:     General: No focal deficit present.     Mental Status: She is alert and oriented to person, place, and time.  Psychiatric:        Mood and Affect: Mood and affect and mood normal.      UC Treatments / Results  Labs (all labs ordered are listed, but only abnormal results are displayed) Labs Reviewed  CERVICOVAGINAL ANCILLARY ONLY    EKG   Radiology No results found.  Procedures Procedures (including critical care  time)  Medications Ordered in UC Medications - No data to display  Initial Impression / Assessment and Plan / UC Course  I have reviewed the triage vital signs and the nursing notes.  Pertinent labs & imaging results that were available during my care of the patient were reviewed by me and considered in my medical decision making (see chart for details).     MDM  Pt reports she frequently has bv.  Pt ask for rx for diflucan because metronidazole causes her to get yeast. Final Clinical Impressions(s) / UC Diagnoses   Final diagnoses:  BV (bacterial vaginosis)   Discharge Instructions   None    ED Prescriptions    Medication Sig Dispense Auth. Provider   metroNIDAZOLE (FLAGYL) 500 MG tablet Take 1 tablet (500 mg total) by mouth 3 (three) times daily for 7 days. 14 tablet Miranda Schneider K, [place]   fluconazole (DIFLUCAN) 150 MG tablet Take 1 tablet (150 mg total) by mouth daily. 1 tablet Miranda Schneider, [place]     PDMP not reviewed this encounter.  An After Visit Summary was printed and given to the patient.    Miranda Schneider, [place] 06/27/20 9798

## 2020-06-28 LAB — CERVICOVAGINAL ANCILLARY ONLY
Bacterial Vaginitis (gardnerella): POSITIVE — AB
Candida Glabrata: NEGATIVE
Candida Vaginitis: NEGATIVE
Chlamydia: NEGATIVE
Comment: NEGATIVE
Comment: NEGATIVE
Comment: NEGATIVE
Comment: NEGATIVE
Comment: NEGATIVE
Comment: NORMAL
Neisseria Gonorrhea: NEGATIVE
Trichomonas: POSITIVE — AB

## 2020-08-11 ENCOUNTER — Encounter: Payer: Self-pay | Admitting: Emergency Medicine

## 2020-08-11 ENCOUNTER — Other Ambulatory Visit: Payer: Self-pay

## 2020-08-11 ENCOUNTER — Ambulatory Visit
Admission: EM | Admit: 2020-08-11 | Discharge: 2020-08-11 | Disposition: A | Discharge status: Home / Self Care | Payer: 59 | Attending: Family Medicine | Admitting: Family Medicine

## 2020-08-11 DIAGNOSIS — N898 Other specified noninflammatory disorders of vagina: Secondary | ICD-10-CM

## 2020-08-11 MED ORDER — FLUCONAZOLE 150 MG PO TABS: ORAL_TABLET | ORAL | 0 refills | Status: DC

## 2020-08-11 NOTE — Discharge Instructions (Addendum)
We have sent testing for sexually transmitted infections. We will notify you of any positive results once they are received. If required, we will prescribe any medications you might need.  Please refrain from all sexual activity for at least the next seven days.  

## 2020-08-11 NOTE — ED Triage Notes (Signed)
Pt presents for STD testing. States having vaginal itching and pelvic pain. Was treating on 2/14 for BV and Trich. RX sent to pharm read 3 times a day for 7 day but only 14 tablets were sent in.

## 2020-08-11 NOTE — ED Provider Notes (Signed)
  [facility]   [id number] 08/11/20 Arrival Time: 1002  ASSESSMENT & PLAN:  1. Vaginal itching       Discharge Instructions     We have sent testing for sexually transmitted infections. We will notify you of any positive results once they are received. If required, we will prescribe any medications you might need.  Please refrain from all sexual activity for at least the next seven days.    Without s/s of PID.  Labs Reviewed  CERVICOVAGINAL ANCILLARY ONLY    Will notify of any positive results. Instructed to refrain from sexual activity for at least seven days.  Reviewed expectations re: course of current medical issues. Questions answered. Outlined signs and symptoms indicating need for more acute intervention. Patient verbalized understanding. After Visit Summary given.   SUBJECTIVE:  Miranda Schneider is a 25 y.o. female; tx for trich last month; better; now with slight vag itching; "milky discharge occasionally"; no abd/pelvic pain; afebrile. Is sexually active.  No LMP recorded (lmp unknown). (Menstrual status: Irregular Periods).   OBJECTIVE:  Vitals:   08/11/20 1028  BP: 105/71  Pulse: 74  Resp: 16  Temp: 98.4 F (36.9 C)  TempSrc: Oral  SpO2: 97%     General appearance: alert, cooperative, appears stated age and no distress Lungs: unlabored respirations; speaks full sentences without difficulty Back: no CVA tenderness; FROM at waist GU: deferred Skin: warm and dry Psychological: alert and cooperative; normal mood and affect.   Labs Reviewed  CERVICOVAGINAL ANCILLARY ONLY    No Known Allergies  Past Medical History:  Diagnosis Date  . Vitamin D deficiency    Family History  Problem Relation Age of Onset  . Hypertension Paternal Grandmother   . Heart disease Neg Hx   . Stroke Neg Hx   . Diabetes Neg Hx   . Cancer Neg Hx    Social History   Socioeconomic History  . Marital status: Significant Other    Spouse name: Not on  file  . Number of children: Not on file  . Years of education: Not on file  . Highest education level: Not on file  Occupational History  . Not on file  Tobacco Use  . Smoking status: Never Smoker  . Smokeless tobacco: Never Used  Substance and Sexual Activity  . Alcohol use: No  . Drug use: No  . Sexual activity: Yes    Birth control/protection: Condom  Other Topics Concern  . 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  Does exercise throughout the year.   Grades - ABs.  Plans to go [employer], wants to pursue nursing.  Volunteers at hospital.     Social Determinants of Health   Financial Resource Strain: Not on file  Food Insecurity: Not on file  Transportation Needs: Not on file  Physical Activity: Not on file  Stress: Not on file  Social Connections: Not on file  Intimate Partner Violence: Not on file          Mardella Layman, MD 08/11/20 1056

## 2020-08-12 ENCOUNTER — Telehealth (HOSPITAL_COMMUNITY): Payer: Self-pay | Admitting: Emergency Medicine

## 2020-08-12 LAB — CERVICOVAGINAL ANCILLARY ONLY
Bacterial Vaginitis (gardnerella): POSITIVE — AB
Candida Glabrata: NEGATIVE
Candida Vaginitis: NEGATIVE
Chlamydia: NEGATIVE
Comment: NEGATIVE
Comment: NEGATIVE
Comment: NEGATIVE
Comment: NEGATIVE
Comment: NEGATIVE
Comment: NORMAL
Neisseria Gonorrhea: NEGATIVE
Trichomonas: NEGATIVE

## 2020-08-12 MED ORDER — METRONIDAZOLE 500 MG PO TABS: 500.0000 mg | ORAL_TABLET | Freq: Two times a day (BID) | ORAL | 0 refills | Status: DC

## 2020-09-10 ENCOUNTER — Encounter: Payer: Self-pay | Admitting: Emergency Medicine

## 2020-09-10 ENCOUNTER — Other Ambulatory Visit: Payer: Self-pay

## 2020-09-10 ENCOUNTER — Ambulatory Visit
Admission: EM | Admit: 2020-09-10 | Discharge: 2020-09-10 | Disposition: A | Discharge status: Home / Self Care | Payer: 59 | Attending: Emergency Medicine | Admitting: Emergency Medicine

## 2020-09-10 DIAGNOSIS — N760 Acute vaginitis: Secondary | ICD-10-CM

## 2020-09-10 DIAGNOSIS — B9689 Other specified bacterial agents as the cause of diseases classified elsewhere: Secondary | ICD-10-CM | POA: Diagnosis present

## 2020-09-10 LAB — POCT URINALYSIS DIP (MANUAL ENTRY)
Bilirubin, UA: NEGATIVE
Blood, UA: NEGATIVE
Glucose, UA: NEGATIVE mg/dL
Ketones, POC UA: NEGATIVE mg/dL
Leukocytes, UA: NEGATIVE
Nitrite, UA: NEGATIVE
Protein Ur, POC: NEGATIVE mg/dL
Spec Grav, UA: 1.02 (ref 1.010–1.025)
Urobilinogen, UA: 0.2 E.U./dL
pH, UA: 5.5 (ref 5.0–8.0)

## 2020-09-10 LAB — POCT URINE PREGNANCY: Preg Test, Ur: NEGATIVE

## 2020-09-10 MED ORDER — METRONIDAZOLE 0.75 % VA GEL: 1.0000 | VAGINAL | 0 refills | Status: DC

## 2020-09-10 MED ORDER — METRONIDAZOLE 0.75 % VA GEL: 1.0000 | Freq: Every day | VAGINAL | 0 refills | Status: AC

## 2020-09-10 NOTE — ED Provider Notes (Signed)
[name]    CSN: [id number] Arrival date & time: 09/10/20  0907      History   Chief Complaint Chief Complaint  Patient presents with  . Vaginal Discharge  . Abdominal Pain    HPI Miranda Schneider is a 25 y.o. female presenting today for evaluation of vaginal discharge.  Reports that she has had vaginal discharge and pelvic pressure over the past week.  Took 1 Diflucan at home without any change in symptoms.  Reports recurrent history of BV and feels slightly similar at times.  Reports that she last had this approximately 1 month ago. often has poor tolerance to oral tablets.  HPI  Past Medical History:  Diagnosis Date  . Vitamin D deficiency     Patient Active Problem List   Diagnosis Date Noted  . Abdominal discomfort 03/06/2016  . Vaginal discharge 03/06/2016  . Shoulder subluxation, right 03/26/2011    History reviewed. No pertinent surgical history.  OB History    Gravida  1   Para      Term      Preterm      AB      Living        SAB      IAB      Ectopic      Multiple      Live Births               Home Medications    Prior to Admission medications   Medication Sig Start Date End Date Taking? Authorizing Provider  metroNIDAZOLE (METROGEL VAGINAL) 0.75 % vaginal gel Place 1 Applicatorful vaginally at bedtime for 5 days. 09/10/20 09/15/20 Yes Salvadore Valvano C, PA-C  metroNIDAZOLE (METROGEL VAGINAL) 0.75 % vaginal gel Place 1 Applicatorful vaginally 2 (two) times a week. 09/12/20  Yes Giannah Zavadil C, PA-C  fluconazole (DIFLUCAN) 150 MG tablet Take one tablet by mouth as a single dose. May repeat in 3 days if symptoms persist. 08/11/20   Mardella Layman, MD  omeprazole (PRILOSEC) 20 MG capsule Take 1 capsule (20 mg total) by mouth daily. 12/15/18 07/01/19  Georgetta Haber, NP    Family History Family History  Problem Relation Age of Onset  . Hypertension Paternal Grandmother   . Heart disease Neg Hx   . Stroke Neg Hx   .  Diabetes Neg Hx   . Cancer Neg Hx     Social History Social History   Tobacco Use  . Smoking status: Never Smoker  . Smokeless tobacco: Never Used  Substance Use Topics  . Alcohol use: No  . Drug use: No     Allergies   Patient has no known allergies.   Review of Systems Review of Systems  Constitutional: Negative for fever.  Respiratory: Negative for shortness of breath.   Cardiovascular: Negative for chest pain.  Gastrointestinal: Negative for abdominal pain, diarrhea, nausea and vomiting.  Genitourinary: Positive for vaginal discharge. Negative for dysuria, flank pain, genital sores, hematuria, menstrual problem, vaginal bleeding and vaginal pain.  Musculoskeletal: Negative for back pain.  Skin: Negative for rash.  Neurological: Negative for dizziness, light-headedness and headaches.     Physical Exam Triage Vital Signs ED Triage Vitals  Enc Vitals Group     BP      Pulse      Resp      Temp      Temp src      SpO2      Weight  Height      Head Circumference      Peak Flow      Pain Score      Pain Loc      Pain Edu?      Excl. in GC?    No data found.  Updated Vital Signs BP 126/83 (BP Location: Left Arm)   Pulse 85   Temp 98.1 F (36.7 C) (Oral)   Resp 18   LMP  (LMP Unknown)   SpO2 98%   Visual Acuity Right Eye Distance:   Left Eye Distance:   Bilateral Distance:    Right Eye Near:   Left Eye Near:    Bilateral Near:     Physical Exam Vitals and nursing note reviewed.  Constitutional:      Appearance: She is well-developed.     Comments: No acute distress  HENT:     Head: Normocephalic and atraumatic.     Nose: Nose normal.  Eyes:     Conjunctiva/sclera: Conjunctivae normal.  Cardiovascular:     Rate and Rhythm: Normal rate.  Pulmonary:     Effort: Pulmonary effort is normal. No respiratory distress.  Abdominal:     General: There is no distension.  Musculoskeletal:        General: Normal range of motion.     Cervical  back: Neck supple.  Skin:    General: Skin is warm and dry.  Neurological:     Mental Status: She is alert and oriented to person, place, and time.      UC Treatments / Results  Labs (all labs ordered are listed, but only abnormal results are displayed) Labs Reviewed  POCT URINALYSIS DIP (MANUAL ENTRY) - Abnormal; Notable for the following components:      Result Value   Clarity, UA cloudy (*)    All other components within normal limits  POCT URINE PREGNANCY  CERVICOVAGINAL ANCILLARY ONLY    EKG   Radiology No results found.  Procedures Procedures (including critical care time)  Medications Ordered in UC Medications - No data to display  Initial Impression / Assessment and Plan / UC Course  I have reviewed the triage vital signs and the nursing notes.  Pertinent labs & imaging results that were available during my care of the patient were reviewed by me and considered in my medical decision making (see chart for details).     UA with negative leuks and nitrites, not suggestive of UTI, pregnancy test negative, recently took Diflucan without changing symptoms, will treat for BV with MetroGel as alternative to oral.  Discussed strict return precautions. Patient verbalized understanding and is agreeable with plan.  Final Clinical Impressions(s) / UC Diagnoses   Final diagnoses:  Bacterial vaginosis     Discharge Instructions     MetroGel at bedtime x5 days then go to twice weekly for prevention Swab pending Follow-up if not improving or worsening    ED Prescriptions    Medication Sig Dispense Auth. Provider   metroNIDAZOLE (METROGEL VAGINAL) 0.75 % vaginal gel Place 1 Applicatorful vaginally at bedtime for 5 days. 70 g Jayziah Bankhead C, PA-C   metroNIDAZOLE (METROGEL VAGINAL) 0.75 % vaginal gel Place 1 Applicatorful vaginally 2 (two) times a week. 70 g Keslyn Teater, Derma C, PA-C     PDMP not reviewed this encounter.   Lew Dawes, PA-C 09/10/20  1038

## 2020-09-10 NOTE — ED Triage Notes (Signed)
Pt here for vaginal discharge and some pressure; pt sts took meds for yeast without relief

## 2020-09-10 NOTE — Discharge Instructions (Signed)
MetroGel at bedtime x5 days then go to twice weekly for prevention Swab pending Follow-up if not improving or worsening

## 2020-09-12 LAB — CERVICOVAGINAL ANCILLARY ONLY
Bacterial Vaginitis (gardnerella): NEGATIVE
Candida Glabrata: NEGATIVE
Candida Vaginitis: NEGATIVE
Chlamydia: NEGATIVE
Comment: NEGATIVE
Comment: NEGATIVE
Comment: NEGATIVE
Comment: NEGATIVE
Comment: NEGATIVE
Comment: NORMAL
Neisseria Gonorrhea: NEGATIVE
Trichomonas: NEGATIVE

## 2020-10-11 ENCOUNTER — Ambulatory Visit (HOSPITAL_COMMUNITY)
Admission: EM | Admit: 2020-10-11 | Discharge: 2020-10-11 | Disposition: A | Discharge status: Home / Self Care | Payer: 59 | Attending: Family Medicine | Admitting: Family Medicine

## 2020-10-11 ENCOUNTER — Encounter (HOSPITAL_COMMUNITY): Payer: Self-pay | Admitting: Emergency Medicine

## 2020-10-11 DIAGNOSIS — N760 Acute vaginitis: Secondary | ICD-10-CM | POA: Diagnosis not present

## 2020-10-11 DIAGNOSIS — R1030 Lower abdominal pain, unspecified: Secondary | ICD-10-CM | POA: Diagnosis not present

## 2020-10-11 DIAGNOSIS — N898 Other specified noninflammatory disorders of vagina: Secondary | ICD-10-CM | POA: Insufficient documentation

## 2020-10-11 LAB — POCT URINALYSIS DIPSTICK, ED / UC
Bilirubin Urine: NEGATIVE
Glucose, UA: NEGATIVE mg/dL
Hgb urine dipstick: NEGATIVE
Leukocytes,Ua: NEGATIVE
Nitrite: NEGATIVE
Protein, ur: NEGATIVE mg/dL
Specific Gravity, Urine: 1.02 (ref 1.005–1.030)
Urobilinogen, UA: 0.2 mg/dL (ref 0.0–1.0)
pH: 7.5 (ref 5.0–8.0)

## 2020-10-11 LAB — POC URINE PREG, ED: Preg Test, Ur: NEGATIVE

## 2020-10-11 MED ORDER — METRONIDAZOLE 500 MG PO TABS: 500.0000 mg | ORAL_TABLET | Freq: Two times a day (BID) | ORAL | 0 refills | Status: DC

## 2020-10-11 MED ORDER — FLUCONAZOLE 150 MG PO TABS: ORAL_TABLET | ORAL | 0 refills | Status: DC

## 2020-10-11 NOTE — Discharge Instructions (Addendum)
Take metronidazole twice a day for 1 week.  This will cover her bacterial vaginosis.  You should not drink any alcohol while taking this medication and for 3 days after you finish the course.  I have called in 2 doses of Diflucan in case you develop yeast infection symptoms.  We will contact you if additional treatment is required.  If you have any worsening symptoms please return for reevaluation.

## 2020-10-11 NOTE — ED Triage Notes (Signed)
Pt presents with lower abdominal pain, vaginal discharge, and urinary frequency xs 2-3 week. States last period was extremely light. Took two at home pregnancy test with no result.

## 2020-10-11 NOTE — ED Provider Notes (Signed)
[facility]    CSN: [id number] Arrival date & time: 10/11/20  1940      History   Chief Complaint Chief Complaint  Patient presents with  . Vaginal Discharge  . Abdominal Pain  . Urinary Frequency    HPI Miranda Schneider is a 25 y.o. female.   Patient presents today with a several day history of lower abdominal pain.  She reports associated urinary frequency and vaginal discharge.  She has had some nausea and so took an at-home pregnancy test that was negative.  Reports LMP 09/22/2020 but states it was much lighter than normal.  She has a history of recurrent bacterial vaginosis and states current symptoms are similar but not identical to previous episodes of this condition.  She is sexually active and has no concern for STI but is open to testing.  She denies any changes to soaps or detergents.  Denies any recent antibiotic use.  She denies any recent illness or additional symptoms including fever, persistent nausea/vomiting interfering with oral intake, headache, dizziness, syncope, chest pain, shortness of breath.      Past Medical History:  Diagnosis Date  . Vitamin D deficiency     Patient Active Problem List   Diagnosis Date Noted  . Abdominal discomfort 03/06/2016  . Vaginal discharge 03/06/2016  . Shoulder subluxation, right 03/26/2011    History reviewed. No pertinent surgical history.  OB History    Gravida  1   Para      Term      Preterm      AB      Living        SAB      IAB      Ectopic      Multiple      Live Births               Home Medications    Prior to Admission medications   Medication Sig Start Date End Date Taking? Authorizing Provider  metroNIDAZOLE (FLAGYL) 500 MG tablet Take 1 tablet (500 mg total) by mouth 2 (two) times daily. 10/11/20  Yes Rayley Gao K, PA-C  fluconazole (DIFLUCAN) 150 MG tablet Take one tablet by mouth as a single dose. May repeat in 3 days if symptoms persist. 10/11/20   Buckley Bradly, Noberto Retort, PA-C  omeprazole (PRILOSEC) 20 MG capsule Take 1 capsule (20 mg total) by mouth daily. 12/15/18 07/01/19  Georgetta Haber, NP    Family History Family History  Problem Relation Age of Onset  . Hypertension Paternal Grandmother   . Heart disease Neg Hx   . Stroke Neg Hx   . Diabetes Neg Hx   . Cancer Neg Hx     Social History Social History   Tobacco Use  . Smoking status: Never Smoker  . Smokeless tobacco: Never Used  Substance Use Topics  . Alcohol use: No  . Drug use: No     Allergies   Patient has no known allergies.   Review of Systems Review of Systems  Constitutional: Negative for activity change, appetite change, fatigue and fever.  Respiratory: Negative for cough and shortness of breath.   Cardiovascular: Negative for chest pain.  Gastrointestinal: Positive for abdominal pain (lower). Negative for diarrhea, nausea and vomiting.  Genitourinary: Positive for dysuria, frequency, urgency and vaginal discharge. Negative for pelvic pain, vaginal bleeding and vaginal pain.  Musculoskeletal: Negative for arthralgias, back pain and myalgias.  Neurological: Negative for dizziness, light-headedness and headaches.  Physical Exam Triage Vital Signs ED Triage Vitals  Enc Vitals Group     BP 10/11/20 2027 123/82     Pulse Rate 10/11/20 2027 76     Resp 10/11/20 2027 17     Temp 10/11/20 2027 98.7 F (37.1 C)     Temp Source 10/11/20 2027 Oral     SpO2 10/11/20 2027 98 %     Weight --      Height --      Head Circumference --      Peak Flow --      Pain Score 10/11/20 2026 5     Pain Loc --      Pain Edu? --      Excl. in GC? --    No data found.  Updated Vital Signs BP 123/82 (BP Location: Left Arm)   Pulse 76   Temp 98.7 F (37.1 C) (Oral)   Resp 17   LMP 09/22/2020   SpO2 98%   Visual Acuity Right Eye Distance:   Left Eye Distance:   Bilateral Distance:    Right Eye Near:   Left Eye Near:    Bilateral Near:     Physical Exam Vitals  reviewed.  Constitutional:      General: She is awake. She is not in acute distress.    Appearance: Normal appearance. She is normal weight. She is not ill-appearing.     Comments: Very pleasant female appears stated age in no acute distress  HENT:     Head: Normocephalic and atraumatic.  Cardiovascular:     Rate and Rhythm: Normal rate and regular rhythm.     Heart sounds: Normal heart sounds. No murmur heard.   Pulmonary:     Effort: Pulmonary effort is normal.     Breath sounds: Normal breath sounds. No wheezing, rhonchi or rales.     Comments: Clear to auscultation bilaterally Abdominal:     General: Bowel sounds are normal.     Palpations: Abdomen is soft.     Tenderness: There is abdominal tenderness in the suprapubic area. There is no right CVA tenderness, left CVA tenderness, guarding or rebound.     Comments: Benign abdominal exam.  Mild tenderness palpation in suprapubic region.  No evidence of acute abdomen on physical exam.  No CVA tenderness.  Genitourinary:    Comments: Exam deferred Musculoskeletal:     Right lower leg: No edema.     Left lower leg: No edema.  Psychiatric:        Behavior: Behavior is cooperative.      UC Treatments / Results  Labs (all labs ordered are listed, but only abnormal results are displayed) Labs Reviewed  POCT URINALYSIS DIPSTICK, ED / UC - Abnormal; Notable for the following components:      Result Value   Ketones, ur TRACE (*)    All other components within normal limits  URINE CULTURE  POC URINE PREG, ED  CERVICOVAGINAL ANCILLARY ONLY    EKG   Radiology No results found.  Procedures Procedures (including critical care time)  Medications Ordered in UC Medications - No data to display  Initial Impression / Assessment and Plan / UC Course  I have reviewed the triage vital signs and the nursing notes.  Pertinent labs & imaging results that were available during my care of the patient were reviewed by me and  considered in my medical decision making (see chart for details).     UA showed ketones but no  evidence of infection.  Urine pregnancy test was negative in office today.  STI swab collected-results pending.  Given clinical presentation will treat for bacterial vaginosis with metronidazole.  Patient was instructed not to drink any alcohol with this medication due to Antabuse side effects.  She reports often getting a yeast infection following antibiotic use including metronidazole so was given 2 doses of Diflucan to be used as needed.  Recommend she use hypoallergenic soaps and detergents and wear loosefitting cotton underwear.  We will determine if additional treatment is necessary based on laboratory findings.  Urine culture was obtained to rule out UTI given associated urinary symptoms will defer additional antibiotic use until culture results are obtained.  Discussed alarm symptoms that warrant emergent evaluation.  Strict return precautions given to which patient expressed understanding.  Final Clinical Impressions(s) / UC Diagnoses   Final diagnoses:  Lower abdominal pain  Vaginal discharge  Acute vaginitis     Discharge Instructions     Take metronidazole twice a day for 1 week.  This will cover her bacterial vaginosis.  You should not drink any alcohol while taking this medication and for 3 days after you finish the course.  I have called in 2 doses of Diflucan in case you develop yeast infection symptoms.  We will contact you if additional treatment is required.  If you have any worsening symptoms please return for reevaluation.    ED Prescriptions    Medication Sig Dispense Auth. Provider   fluconazole (DIFLUCAN) 150 MG tablet Take one tablet by mouth as a single dose. May repeat in 3 days if symptoms persist. 2 tablet Javeon Macmurray K, PA-C   metroNIDAZOLE (FLAGYL) 500 MG tablet Take 1 tablet (500 mg total) by mouth 2 (two) times daily. 14 tablet Tashae Inda, Noberto Retort, PA-C     PDMP not  reviewed this encounter.   Jeani Hawking, PA-C 10/11/20 2058

## 2020-10-12 LAB — CERVICOVAGINAL ANCILLARY ONLY
Bacterial Vaginitis (gardnerella): NEGATIVE
Candida Glabrata: NEGATIVE
Candida Vaginitis: NEGATIVE
Chlamydia: NEGATIVE
Comment: NEGATIVE
Comment: NEGATIVE
Comment: NEGATIVE
Comment: NEGATIVE
Comment: NEGATIVE
Comment: NORMAL
Neisseria Gonorrhea: NEGATIVE
Trichomonas: NEGATIVE

## 2020-10-13 LAB — URINE CULTURE

## 2020-11-01 ENCOUNTER — Other Ambulatory Visit: Payer: Self-pay

## 2020-11-01 ENCOUNTER — Ambulatory Visit (HOSPITAL_COMMUNITY)
Admission: EM | Admit: 2020-11-01 | Discharge: 2020-11-01 | Disposition: A | Discharge status: Home / Self Care | Payer: 59 | Attending: Emergency Medicine | Admitting: Emergency Medicine

## 2020-11-01 ENCOUNTER — Encounter (HOSPITAL_COMMUNITY): Payer: Self-pay

## 2020-11-01 DIAGNOSIS — R109 Unspecified abdominal pain: Secondary | ICD-10-CM | POA: Insufficient documentation

## 2020-11-01 LAB — POCT URINALYSIS DIPSTICK, ED / UC
Bilirubin Urine: NEGATIVE
Glucose, UA: NEGATIVE mg/dL
Nitrite: NEGATIVE
Protein, ur: 30 mg/dL — AB
Specific Gravity, Urine: >=1.03 (ref 1.005–1.030)
Urobilinogen, UA: 0.2 mg/dL (ref 0.0–1.0)
pH: 5.5 (ref 5.0–8.0)

## 2020-11-01 MED ORDER — FLUCONAZOLE 150 MG PO TABS: ORAL_TABLET | ORAL | 0 refills | Status: DC

## 2020-11-01 NOTE — Discharge Instructions (Addendum)
Take 1 diflucan pill today then in 3 days if still having symptoms you may take 2nd pill  Sti screening pending 2-3 days, you will be called if positive, do not have sex until labs results, if positive do not have sex until treatment is complete and symptoms have resolved   Your urinalysis is negative for infection

## 2020-11-01 NOTE — ED Triage Notes (Signed)
Pt reports muscle spasms in lower right back x3 days (reports was taking some medication but ran out) as well as stomach burning starting yesterday. Also c/o vaginal irritation (starting yesterday) which describes as feeling itchy, red and raw.  LMP approx. 10/24/20.

## 2020-11-01 NOTE — ED Provider Notes (Signed)
[facility]    CSN: [id number] Arrival date & time: 11/01/20  9983      History   Chief Complaint Chief Complaint  Patient presents with   Abdominal Pain   vaginal irritation   Back Pain    HPI Miranda Schneider is a 25 y.o. female.   Patient presents with right flank pain described as spasms, generalized burning sensation in abdomen, nausea, urinary frequency, Miranda Schneider discharge, vaginal itching and irritation for 3 days.  Diarrhea once yesterday. Sexually active, 1 partner, no condom use. Denies fever, chills, vomiting, constipation, heart burn, indigestion, bloating, increased gas production, recent travel, changes in diet, rash, lesions. LMP 6/13  Past Medical History:  Diagnosis Date   Vitamin D deficiency     Patient Active Problem List   Diagnosis Date Noted   Abdominal discomfort 03/06/2016   Vaginal discharge 03/06/2016   Shoulder subluxation, right 03/26/2011    History reviewed. No pertinent surgical history.  OB History     Gravida  1   Para      Term      Preterm      AB      Living         SAB      IAB      Ectopic      Multiple      Live Births               Home Medications    Prior to Admission medications   Medication Sig Start Date End Date Taking? Authorizing Provider  fluconazole (DIFLUCAN) 150 MG tablet Take one tablet by mouth as a single dose. May repeat in 3 days if symptoms persist. 10/11/20   Raspet, Denny Peon K, PA-C  metroNIDAZOLE (FLAGYL) 500 MG tablet Take 1 tablet (500 mg total) by mouth 2 (two) times daily. 10/11/20   Raspet, Noberto Retort, PA-C  omeprazole (PRILOSEC) 20 MG capsule Take 1 capsule (20 mg total) by mouth daily. 12/15/18 07/01/19  Georgetta Haber, NP    Family History Family History  Problem Relation Age of Onset   Diabetes Paternal Grandmother    Hypertension Paternal Grandmother    Diabetes Maternal Grandmother    Hypertension Paternal Grandfather    Heart disease Neg Hx    Stroke Neg Hx     Cancer Neg Hx     Social History Social History   Tobacco Use   Smoking status: Never   Smokeless tobacco: Never  Substance Use Topics   Alcohol use: No   Drug use: No     Allergies   Patient has no known allergies.   Review of Systems Review of Systems Defer to HPI    Physical Exam Triage Vital Signs ED Triage Vitals  Enc Vitals Group     BP 11/01/20 0846 125/75     Pulse Rate 11/01/20 0846 78     Resp 11/01/20 0846 18     Temp 11/01/20 0846 97.9 F (36.6 C)     Temp src --      SpO2 11/01/20 0846 97 %     Weight --      Height --      Head Circumference --      Peak Flow --      Pain Score 11/01/20 0843 7     Pain Loc --      Pain Edu? --      Excl. in GC? --    No data found.  Updated Vital Signs BP 125/75   Pulse 78   Temp 97.9 F (36.6 C)   Resp 18   LMP 10/24/2020   SpO2 97%   Visual Acuity Right Eye Distance:   Left Eye Distance:   Bilateral Distance:    Right Eye Near:   Left Eye Near:    Bilateral Near:     Physical Exam Constitutional:      Appearance: She is well-developed and normal weight.  HENT:     Head: Normocephalic.  Eyes:     Extraocular Movements: Extraocular movements intact.  Pulmonary:     Effort: Pulmonary effort is normal.  Abdominal:     General: Abdomen is flat. Bowel sounds are normal.     Palpations: Abdomen is soft.     Tenderness: There is no abdominal tenderness. There is right CVA tenderness.  Genitourinary:    Comments: Deferred, self collect vaginal swab Musculoskeletal:        General: Normal range of motion.     Cervical back: Normal range of motion.  Skin:    General: Skin is warm and dry.     Coloration: Skin is not pale.  Neurological:     General: No focal deficit present.     Mental Status: She is alert and oriented to person, place, and time.  Psychiatric:        Mood and Affect: Mood normal.        Behavior: Behavior normal.     UC Treatments / Results  Labs (all labs ordered  are listed, but only abnormal results are displayed) Labs Reviewed  POCT URINALYSIS DIPSTICK, ED / UC  CERVICOVAGINAL ANCILLARY ONLY    EKG   Radiology No results found.  Procedures Procedures (including critical care time)  Medications Ordered in UC Medications - No data to display  Initial Impression / Assessment and Plan / UC Course  I have reviewed the triage vital signs and the nursing notes.  Pertinent labs & imaging results that were available during my care of the patient were reviewed by me and considered in my medical decision making (see chart for details).  Right flank pain  While self collect vaginal swab, patient attest to change in discharge color for Miranda Schneider to copious yellow fluid.   Urinalysis- + for leukocytes Sti screen pending 2-3 days, will treat per protocol, advised abstinence until labs result and/or treament complete Diflucan 150 mg once, 150 mg in 3 days prn, will wait until lab results before using  Final Clinical Impressions(s) / UC Diagnoses   Final diagnoses:  None   Discharge Instructions   None    ED Prescriptions   None    PDMP not reviewed this encounter.   Valinda Hoar, [place] 11/01/20 [phone]

## 2020-11-02 LAB — CERVICOVAGINAL ANCILLARY ONLY
Bacterial Vaginitis (gardnerella): NEGATIVE
Candida Glabrata: NEGATIVE
Candida Vaginitis: POSITIVE — AB
Chlamydia: NEGATIVE
Comment: NEGATIVE
Comment: NEGATIVE
Comment: NEGATIVE
Comment: NEGATIVE
Comment: NEGATIVE
Comment: NORMAL
Neisseria Gonorrhea: NEGATIVE
Trichomonas: NEGATIVE

## 2020-11-16 ENCOUNTER — Ambulatory Visit (HOSPITAL_COMMUNITY)
Admission: EM | Admit: 2020-11-16 | Discharge: 2020-11-16 | Disposition: A | Discharge status: Home / Self Care | Payer: 59

## 2020-11-16 ENCOUNTER — Other Ambulatory Visit: Payer: Self-pay

## 2020-11-16 DIAGNOSIS — Z5321 Procedure and treatment not carried out due to patient leaving prior to being seen by health care provider: Secondary | ICD-10-CM

## 2020-11-16 NOTE — ED Notes (Signed)
PT was called by this Clinical research associate on her mobile . Pt reported she was not going to be seen.

## 2021-01-27 ENCOUNTER — Other Ambulatory Visit: Payer: Self-pay

## 2021-01-27 ENCOUNTER — Ambulatory Visit
Admission: RE | Admit: 2021-01-27 | Discharge: 2021-01-27 | Disposition: A | Discharge status: Home / Self Care | Payer: 59 | Source: Ambulatory Visit | Attending: Urgent Care | Admitting: Urgent Care

## 2021-01-27 VITALS — BP 152/82 | HR 74 | Temp 98.4°F | Resp 20

## 2021-01-27 DIAGNOSIS — N6452 Nipple discharge: Secondary | ICD-10-CM

## 2021-01-27 DIAGNOSIS — N898 Other specified noninflammatory disorders of vagina: Secondary | ICD-10-CM | POA: Diagnosis not present

## 2021-01-27 DIAGNOSIS — N760 Acute vaginitis: Secondary | ICD-10-CM | POA: Diagnosis not present

## 2021-01-27 LAB — POCT URINE PREGNANCY: Preg Test, Ur: NEGATIVE

## 2021-01-27 MED ORDER — METRONIDAZOLE 500 MG PO TABS: 500.0000 mg | ORAL_TABLET | Freq: Two times a day (BID) | ORAL | 0 refills | Status: DC

## 2021-01-27 MED ORDER — FLUCONAZOLE 150 MG PO TABS: 150.0000 mg | ORAL_TABLET | ORAL | 0 refills | Status: DC

## 2021-01-27 NOTE — ED Triage Notes (Signed)
Pt c/o   1) left breast discharge with associated lesion pt believes is an insect bite. Onset 4-5 days ago. Described as yellow/green or yellow/red thicker consistently scant to mild amount.   2) vaginal discharge with associated odor, polyuria, pelvic pain. Onset approx 5 days ago states has taken "boric acid suppositories" that offered temporary relief. States she routinely gets BV.

## 2021-01-27 NOTE — ED Provider Notes (Signed)
[facility]   MRN: [id number] DOB: 1995-12-11  Subjective:   Miranda Schneider is a 25 y.o. female presenting for 5-day history of recurrent vaginal discharge, pelvic and lower abdominal discomfort.  Has had difficulty with recurrent BV and yeast infections.  She did try boric acid suppositories which helped for a bit but are no longer helping.  She does have a gynecologist but has not followed up with them recently.  She did have trichomoniasis in February and was treated.  She is having sex again with the same individual.  Is not opposed to STI testing.  She also has 4 to 5-day history of discharge from the nipple.  Has an area that she thinks may have suffered an insect bite.  Not on chronic medications.  No Known Allergies  Past Medical History:  Diagnosis Date  . Vitamin D deficiency      History reviewed. No pertinent surgical history.  Family History  Problem Relation Age of Onset  . Diabetes Paternal Grandmother   . Hypertension Paternal Grandmother   . Diabetes Maternal Grandmother   . Hypertension Paternal Grandfather   . Heart disease Neg Hx   . Stroke Neg Hx   . Cancer Neg Hx     Social History   Tobacco Use  . Smoking status: Never  . Smokeless tobacco: Never  Substance Use Topics  . Alcohol use: No  . Drug use: No    ROS   Objective:   Vitals: BP (!) 152/82 (BP Location: Left Arm)   Pulse 74   Temp 98.4 F (36.9 C) (Oral)   Resp 20   LMP 12/12/2020 (Approximate)   SpO2 98%   Physical Exam Constitutional:      General: She is not in acute distress.    Appearance: Normal appearance. She is well-developed. She is not ill-appearing, toxic-appearing or diaphoretic.  HENT:     Head: Normocephalic and atraumatic.     Nose: Nose normal.     Mouth/Throat:     Mouth: Mucous membranes are moist.     Pharynx: Oropharynx is clear.  Eyes:     General: No scleral icterus.       Right eye: No discharge.        Left eye: No discharge.      Extraocular Movements: Extraocular movements intact.     Conjunctiva/sclera: Conjunctivae normal.     Pupils: Pupils are equal, round, and reactive to light.  Cardiovascular:     Rate and Rhythm: Normal rate.  Pulmonary:     Effort: Pulmonary effort is normal.  Chest:  Breasts:    Right: No swelling, bleeding, inverted nipple, mass, nipple discharge, skin change or tenderness.     Left: No swelling, bleeding, inverted nipple, mass, nipple discharge, skin change or tenderness.    Abdominal:     General: Bowel sounds are normal. There is no distension.     Palpations: Abdomen is soft. There is no mass.     Tenderness: There is abdominal tenderness (mild, lower and pelvic). There is no right CVA tenderness, left CVA tenderness, guarding or rebound.  Lymphadenopathy:     Upper Body:     Right upper body: No supraclavicular, axillary or pectoral adenopathy.     Left upper body: No supraclavicular, axillary or pectoral adenopathy.  Skin:    General: Skin is warm and dry.  Neurological:     General: No focal deficit present.     Mental Status: She is alert and oriented  to person, place, and time.  Psychiatric:        Mood and Affect: Mood normal.        Behavior: Behavior normal.        Thought Content: Thought content normal.        Judgment: Judgment normal.    Results for orders placed or performed during the hospital encounter of 01/27/21 (from the past 24 hour(s))  POCT urine pregnancy     Status: None   Collection Time: 01/27/21  9:46 AM  Result Value Ref Range   Preg Test, Ur Negative Negative    Assessment and Plan :   PDMP not reviewed this encounter.  1. Acute vaginitis   2. Vaginal discharge   3. Nipple discharge     Recommended empiric treatment for recurrent bacterial vaginosis, yeast vaginitis with metronidazole and Diflucan.  Labs pending, will treat as appropriate otherwise.  Breast exam unremarkable, no discharge elicited. [employer] served as a  Biomedical engineer.  Patient does not have a primary care provider and therefore emphasized need for this for recheck on her breast discharge which was not noted today on exam.  Provided her with information to different providers.  Counseled patient on potential for adverse effects with medications prescribed/recommended today, ER and return-to-clinic precautions discussed, patient verbalized understanding.    Wallis Bamberg, PA-C 01/27/21 1014

## 2021-01-30 LAB — CERVICOVAGINAL ANCILLARY ONLY
Bacterial Vaginitis (gardnerella): POSITIVE — AB
Candida Glabrata: NEGATIVE
Candida Vaginitis: NEGATIVE
Chlamydia: NEGATIVE
Comment: NEGATIVE
Comment: NEGATIVE
Comment: NEGATIVE
Comment: NEGATIVE
Comment: NEGATIVE
Comment: NORMAL
Neisseria Gonorrhea: NEGATIVE
Trichomonas: NEGATIVE

## 2021-02-08 ENCOUNTER — Emergency Department (HOSPITAL_COMMUNITY): Payer: 59

## 2021-02-08 ENCOUNTER — Encounter (HOSPITAL_COMMUNITY): Payer: Self-pay | Admitting: *Deleted

## 2021-02-08 ENCOUNTER — Emergency Department (HOSPITAL_COMMUNITY)
Admission: EM | Admit: 2021-02-08 | Discharge: 2021-02-08 | Disposition: A | Discharge status: Home / Self Care | Payer: 59 | Attending: Emergency Medicine | Admitting: Emergency Medicine

## 2021-02-08 DIAGNOSIS — M7918 Myalgia, other site: Secondary | ICD-10-CM

## 2021-02-08 DIAGNOSIS — M791 Myalgia, unspecified site: Secondary | ICD-10-CM | POA: Insufficient documentation

## 2021-02-08 DIAGNOSIS — M546 Pain in thoracic spine: Secondary | ICD-10-CM | POA: Diagnosis not present

## 2021-02-08 DIAGNOSIS — M25511 Pain in right shoulder: Secondary | ICD-10-CM | POA: Diagnosis not present

## 2021-02-08 DIAGNOSIS — Y92410 Unspecified street and highway as the place of occurrence of the external cause: Secondary | ICD-10-CM | POA: Insufficient documentation

## 2021-02-08 DIAGNOSIS — S61202A Unspecified open wound of right middle finger without damage to nail, initial encounter: Secondary | ICD-10-CM | POA: Diagnosis not present

## 2021-02-08 DIAGNOSIS — S6991XA Unspecified injury of right wrist, hand and finger(s), initial encounter: Secondary | ICD-10-CM | POA: Diagnosis present

## 2021-02-08 DIAGNOSIS — Z23 Encounter for immunization: Secondary | ICD-10-CM | POA: Diagnosis not present

## 2021-02-08 MED ORDER — IBUPROFEN 200 MG PO TABS
600.0000 mg | ORAL_TABLET | Freq: Once | ORAL | Status: AC
  Administered 2021-02-08: 600 mg via ORAL
  Filled 2021-02-08: qty 3

## 2021-02-08 MED ORDER — NAPROXEN 500 MG PO TABS: 500.0000 mg | ORAL_TABLET | Freq: Two times a day (BID) | ORAL | 0 refills | Status: DC

## 2021-02-08 MED ORDER — CYCLOBENZAPRINE HCL 10 MG PO TABS
5.0000 mg | ORAL_TABLET | Freq: Once | ORAL | Status: AC
  Administered 2021-02-08: 5 mg via ORAL
  Filled 2021-02-08: qty 1

## 2021-02-08 MED ORDER — IBUPROFEN 600 MG PO TABS: 600.0000 mg | ORAL_TABLET | Freq: Four times a day (QID) | ORAL | 0 refills | Status: DC | PRN

## 2021-02-08 MED ORDER — TETANUS-DIPHTH-ACELL PERTUSSIS 5-2.5-18.5 LF-MCG/0.5 IM SUSY
0.5000 mL | PREFILLED_SYRINGE | Freq: Once | INTRAMUSCULAR | Status: AC
  Administered 2021-02-08: 0.5 mL via INTRAMUSCULAR
  Filled 2021-02-08: qty 0.5

## 2021-02-08 MED ORDER — CYCLOBENZAPRINE HCL 5 MG PO TABS: 5.0000 mg | ORAL_TABLET | Freq: Two times a day (BID) | ORAL | 0 refills | Status: DC | PRN

## 2021-02-08 MED ORDER — CYCLOBENZAPRINE HCL 5 MG PO TABS: 5.0000 mg | ORAL_TABLET | Freq: Two times a day (BID) | ORAL | 0 refills | Status: AC | PRN
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
## 2021-02-08 NOTE — Discharge Instructions (Addendum)
You may alternate taking Tylenol and Naproxen as needed for pain control. You may take Naproxen twice daily as directed on your discharge paperwork and you may take  [phone] mg of Tylenol every 6 hours. Do not exceed 4000 mg of Tylenol daily as this can lead to liver damage. Also, make sure to take Naproxen with meals as it can cause an upset stomach. Do not take other NSAIDs while taking Naproxen such as (Aleve, Ibuprofen, Aspirin, Celebrex, etc) and do not take more than the prescribed dose as this can lead to ulcers and bleeding in your GI tract. You may use warm and cold compresses to help with your symptoms.   You were given a prescription for flexeril which is a muscle relaxer.  You should not drive, work, or operate machinery while taking this medication as it can make you very drowsy.     Please follow up with your primary doctor within the next 7-10 days for re-evaluation and further treatment of your symptoms.   Please return to the ER sooner if you have any new or worsening symptoms.

## 2021-03-01 ENCOUNTER — Ambulatory Visit: Payer: 59 | Admitting: Registered Nurse

## 2021-04-05 ENCOUNTER — Ambulatory Visit
Admission: RE | Admit: 2021-04-05 | Discharge: 2021-04-05 | Disposition: A | Discharge status: Home / Self Care | Payer: 59 | Source: Ambulatory Visit | Attending: Internal Medicine | Admitting: Internal Medicine

## 2021-04-05 ENCOUNTER — Other Ambulatory Visit: Payer: Self-pay

## 2021-04-05 VITALS — BP 123/84 | HR 82 | Temp 98.1°F | Resp 16

## 2021-04-05 DIAGNOSIS — Z113 Encounter for screening for infections with a predominantly sexual mode of transmission: Secondary | ICD-10-CM

## 2021-04-05 DIAGNOSIS — Z3202 Encounter for pregnancy test, result negative: Secondary | ICD-10-CM | POA: Insufficient documentation

## 2021-04-05 DIAGNOSIS — N898 Other specified noninflammatory disorders of vagina: Secondary | ICD-10-CM

## 2021-04-05 LAB — POCT URINE PREGNANCY: Preg Test, Ur: NEGATIVE

## 2021-04-05 NOTE — Discharge Instructions (Signed)
Pregnancy test is negative.  Your blood work and vaginal swab are pending.  We will call if they are positive and treat as appropriate.  Please refrain from sexual activity until test results and treatment are complete.  Please follow-up with gynecologist for delayed menstrual cycle.

## 2021-04-05 NOTE — ED Provider Notes (Signed)
[name]    CSN: [id number] Arrival date & time: 04/05/21  1543      History   Chief Complaint Chief Complaint  Patient presents with   SEXUALLY TRANSMITTED DISEASE    HPI Miranda Schneider is a 25 y.o. female.   Presents with vaginal irritation and white vaginal discharge that started approximately 1 week ago.  Patient denies any urinary burning, urinary frequency, hematuria, pelvic pain, abdominal pain, fever, back pain.  Denies any known exposure to STD but has had unprotected sexual intercourse prior to symptoms starting.  Patient also requesting testing for HIV and syphilis but denies any known exposure.  Patient requesting pregnancy test as her last menstrual cycle was 02/24/2021.  Patient is not on any birth control.    Past Medical History:  Diagnosis Date   Vitamin D deficiency     Patient Active Problem List   Diagnosis Date Noted   Abdominal discomfort 03/06/2016   Vaginal discharge 03/06/2016   Shoulder subluxation, right 03/26/2011    History reviewed. No pertinent surgical history.  OB History     Gravida  1   Para      Term      Preterm      AB      Living         SAB      IAB      Ectopic      Multiple      Live Births               Home Medications    Prior to Admission medications   Medication Sig Start Date End Date Taking? Authorizing Provider  fluconazole (DIFLUCAN) 150 MG tablet Take 1 tablet (150 mg total) by mouth once a week. 01/27/21   Wallis Bamberg, PA-C  ibuprofen (ADVIL) 600 MG tablet Take 1 tablet (600 mg total) by mouth every 6 (six) hours as needed. 02/08/21   Couture, Cortni S, PA-C  metroNIDAZOLE (FLAGYL) 500 MG tablet Take 1 tablet (500 mg total) by mouth 2 (two) times daily with a meal. DO NOT CONSUME ALCOHOL WHILE TAKING THIS MEDICATION. 01/27/21   Wallis Bamberg, PA-C  omeprazole (PRILOSEC) 20 MG capsule Take 1 capsule (20 mg total) by mouth daily. 12/15/18 07/01/19  Georgetta Haber, NP     Family History Family History  Problem Relation Age of Onset   Diabetes Paternal Grandmother    Hypertension Paternal Grandmother    Diabetes Maternal Grandmother    Hypertension Paternal Grandfather    Heart disease Neg Hx    Stroke Neg Hx    Cancer Neg Hx     Social History Social History   Tobacco Use   Smoking status: Never   Smokeless tobacco: Never  Substance Use Topics   Alcohol use: No   Drug use: No     Allergies   Patient has no known allergies.   Review of Systems Review of Systems Per HPI  Physical Exam Triage Vital Signs ED Triage Vitals [04/05/21 1618]  Enc Vitals Group     BP 123/84     Pulse Rate 82     Resp 16     Temp 98.1 F (36.7 C)     Temp Source Oral     SpO2 99 %     Weight      Height      Head Circumference      Peak Flow      Pain Score 0  Pain Loc      Pain Edu?      Excl. in [place]?    No data found.  Updated Vital Signs BP 123/84 (BP Location: Left Arm)   Pulse 82   Temp 98.1 F (36.7 C) (Oral)   Resp 16   SpO2 99%   Visual Acuity Right Eye Distance:   Left Eye Distance:   Bilateral Distance:    Right Eye Near:   Left Eye Near:    Bilateral Near:     Physical Exam Constitutional:      General: She is not in acute distress.    Appearance: Normal appearance. She is not toxic-appearing or diaphoretic.  HENT:     Head: Normocephalic and atraumatic.  Eyes:     Extraocular Movements: Extraocular movements intact.     Conjunctiva/sclera: Conjunctivae normal.  Cardiovascular:     Rate and Rhythm: Normal rate and regular rhythm.     Pulses: Normal pulses.     Heart sounds: Normal heart sounds.  Pulmonary:     Effort: Pulmonary effort is normal. No respiratory distress.     Breath sounds: Normal breath sounds.  Abdominal:     General: Bowel sounds are normal. There is no distension.     Palpations: Abdomen is soft.     Tenderness: There is no abdominal tenderness.  Genitourinary:    Comments: Deferred  with shared decision making.  Self swab performed. Skin:    General: Skin is warm and dry.  Neurological:     General: No focal deficit present.     Mental Status: She is alert and oriented to person, place, and time. Mental status is at baseline.  Psychiatric:        Mood and Affect: Mood normal.        Behavior: Behavior normal.        Thought Content: Thought content normal.        Judgment: Judgment normal.     UC Treatments / Results  Labs (all labs ordered are listed, but only abnormal results are displayed) Labs Reviewed  HIV ANTIBODY (ROUTINE TESTING W REFLEX)  RPR  POCT URINE PREGNANCY  CERVICOVAGINAL ANCILLARY ONLY    EKG   Radiology No results found.  Procedures Procedures (including critical care time)  Medications Ordered in UC Medications - No data to display  Initial Impression / Assessment and Plan / UC Course  I have reviewed the triage vital signs and the nursing notes.  Pertinent labs & imaging results that were available during my care of the patient were reviewed by me and considered in my medical decision making (see chart for details).     Pregnancy test negative.  Awaiting cervicovaginal swab.  Will await results for treatment.  No red flags on exam.  Do not think urinalysis is needed given no urinary symptoms.  Discussed strict return precautions.  Patient to refrain from sexual activity until test results and treatment are complete.  Patient verbalized understanding and was agreeable with plan. Final Clinical Impressions(s) / UC Diagnoses   Final diagnoses:  Vaginal discharge  Screening examination for venereal disease  Pregnancy test negative     Discharge Instructions      Pregnancy test is negative.  Your blood work and vaginal swab are pending.  We will call if they are positive and treat as appropriate.  Please refrain from sexual activity until test results and treatment are complete.  Please follow-up with gynecologist for  delayed menstrual cycle.  ED Prescriptions   None    PDMP not reviewed this encounter.   Teodora Medici, [place] 04/05/21 [phone]

## 2021-04-05 NOTE — ED Triage Notes (Signed)
Vaginal cramps and discharge starting last Wednesday, used boric acid that helped mildly. Denies bleeding, dysuria. Severe cramps with defecation. Requesting std and pregnancy test, denies birth control use

## 2021-04-06 LAB — HIV ANTIBODY (ROUTINE TESTING W REFLEX): HIV Screen 4th Generation wRfx: NONREACTIVE

## 2021-04-06 LAB — RPR: RPR Ser Ql: NONREACTIVE

## 2021-04-07 LAB — CERVICOVAGINAL ANCILLARY ONLY
Bacterial Vaginitis (gardnerella): POSITIVE — AB
Candida Glabrata: NEGATIVE
Candida Vaginitis: NEGATIVE
Chlamydia: NEGATIVE
Comment: NEGATIVE
Comment: NEGATIVE
Comment: NEGATIVE
Comment: NEGATIVE
Comment: NEGATIVE
Comment: NORMAL
Neisseria Gonorrhea: NEGATIVE
Trichomonas: NEGATIVE

## 2021-04-08 ENCOUNTER — Telehealth (HOSPITAL_COMMUNITY): Payer: Self-pay | Admitting: Emergency Medicine

## 2021-04-08 MED ORDER — FLUCONAZOLE 150 MG PO TABS: 150.0000 mg | ORAL_TABLET | Freq: Once | ORAL | 0 refills | Status: AC

## 2021-04-08 MED ORDER — METRONIDAZOLE 500 MG PO TABS: 500.0000 mg | ORAL_TABLET | Freq: Two times a day (BID) | ORAL | 0 refills | Status: DC

## 2021-05-23 ENCOUNTER — Encounter: Payer: Self-pay | Admitting: Emergency Medicine

## 2021-05-23 ENCOUNTER — Ambulatory Visit
Admission: EM | Admit: 2021-05-23 | Discharge: 2021-05-23 | Disposition: A | Discharge status: Home / Self Care | Payer: 59 | Attending: Internal Medicine | Admitting: Internal Medicine

## 2021-05-23 ENCOUNTER — Other Ambulatory Visit: Payer: Self-pay

## 2021-05-23 DIAGNOSIS — N898 Other specified noninflammatory disorders of vagina: Secondary | ICD-10-CM

## 2021-05-23 DIAGNOSIS — Z113 Encounter for screening for infections with a predominantly sexual mode of transmission: Secondary | ICD-10-CM

## 2021-05-23 LAB — POCT URINE PREGNANCY: Preg Test, Ur: NEGATIVE

## 2021-05-23 MED ORDER — CLINDAMYCIN HCL 150 MG PO CAPS: 300.0000 mg | ORAL_CAPSULE | Freq: Two times a day (BID) | ORAL | 0 refills | Status: AC

## 2021-05-23 MED ORDER — FLUCONAZOLE 150 MG PO TABS: 150.0000 mg | ORAL_TABLET | Freq: Every day | ORAL | 0 refills | Status: DC

## 2021-05-23 NOTE — ED Triage Notes (Signed)
Pt sts vaginal discharge with hx of BV; pt sts period was late this month which is atypical

## 2021-05-23 NOTE — Discharge Instructions (Signed)
You are being treated for suspected bacterial vaginosis with clindamycin antibiotic.  Please take this medication with food to avoid diarrhea or stomach upset.  Vaginal swab is pending.  We will call if it is positive.  Please follow-up with provided contact information for gynecology for recurrent bacterial vaginosis.

## 2021-05-23 NOTE — ED Provider Notes (Signed)
[name]    CSN: [id number] Arrival date & time: 05/23/21  [id number]      History   Chief Complaint Chief Complaint  Patient presents with   Vaginal Discharge         HPI Miranda Schneider is a 26 y.o. female.   Patient presents today for further evaluation of 1 week history of clear vaginal discharge.  Patient reports that she has recurrent bacterial vaginosis with last infection being in November 2022.  Denies urinary burning, urinary frequency, pelvic pain, abdominal pain, fever, back pain, irregular vaginal bleeding.  Her last menstrual cycle was 05/14/21.  She reports that this menstrual cycle was a few days late so she is requesting a pregnancy test as well.  Denies any known exposure to STD.   Vaginal Discharge  Past Medical History:  Diagnosis Date   Vitamin D deficiency     Patient Active Problem List   Diagnosis Date Noted   Abdominal discomfort 03/06/2016   Vaginal discharge 03/06/2016   Shoulder subluxation, right 03/26/2011    History reviewed. No pertinent surgical history.  OB History     Gravida  1   Para      Term      Preterm      AB      Living         SAB      IAB      Ectopic      Multiple      Live Births               Home Medications    Prior to Admission medications   Medication Sig Start Date End Date Taking? Authorizing Provider  clindamycin (CLEOCIN) 150 MG capsule Take 2 capsules (300 mg total) by mouth every 12 (twelve) hours for 7 days. 05/23/21 05/30/21 Yes Wendie Diskin, Acie FredricksonHaley E, FNP  fluconazole (DIFLUCAN) 150 MG tablet Take 1 tablet (150 mg total) by mouth daily. Take at first of vaginal yeast 05/23/21  Yes [place], [place] E, [place]  ibuprofen (ADVIL) 600 MG tablet Take 1 tablet (600 mg total) by mouth every 6 (six) hours as needed. 02/08/21   Couture, Cortni S, PA-C  metroNIDAZOLE (FLAGYL) 500 MG tablet Take 1 tablet (500 mg total) by mouth 2 (two) times daily. Patient not taking: Reported on 05/23/2021 04/08/21    Merrilee JanskyLamptey, Philip O, MD  omeprazole (PRILOSEC) 20 MG capsule Take 1 capsule (20 mg total) by mouth daily. 12/15/18 07/01/19  Georgetta HaberBurky, Natalie B, NP    Family History Family History  Problem Relation Age of Onset   Diabetes Paternal Grandmother    Hypertension Paternal Grandmother    Diabetes Maternal Grandmother    Hypertension Paternal Grandfather    Heart disease Neg Hx    Stroke Neg Hx    Cancer Neg Hx     Social History Social History   Tobacco Use   Smoking status: Never   Smokeless tobacco: Never  Substance Use Topics   Alcohol use: No   Drug use: No     Allergies   Patient has no known allergies.   Review of Systems Review of Systems Per HPI  Physical Exam Triage Vital Signs ED Triage Vitals [05/23/21 0853]  Enc Vitals Group     BP 114/66     Pulse Rate (!) 56     Resp 18     Temp 98.6 F (37 C)     Temp Source Oral     SpO2  98 %     Weight      Height      Head Circumference      Peak Flow      Pain Score 0     Pain Loc      Pain Edu?      Excl. in [place]?    No data found.  Updated Vital Signs BP 114/66 (BP Location: Left Arm)    Pulse (!) 56    Temp 98.6 F (37 C) (Oral)    Resp 18    SpO2 98%   Visual Acuity Right Eye Distance:   Left Eye Distance:   Bilateral Distance:    Right Eye Near:   Left Eye Near:    Bilateral Near:     Physical Exam Constitutional:      General: She is not in acute distress.    Appearance: Normal appearance. She is not toxic-appearing or diaphoretic.  HENT:     Head: Normocephalic and atraumatic.  Eyes:     Extraocular Movements: Extraocular movements intact.     Conjunctiva/sclera: Conjunctivae normal.  Cardiovascular:     Rate and Rhythm: Normal rate and regular rhythm.     Pulses: Normal pulses.     Heart sounds: Normal heart sounds.  Pulmonary:     Effort: Pulmonary effort is normal.     Breath sounds: Normal breath sounds.  Abdominal:     General: Abdomen is flat. Bowel sounds are normal. There is  no distension.     Palpations: Abdomen is soft.     Tenderness: There is no abdominal tenderness.  Genitourinary:    Comments: Deferred with shared decision making.  Self swab performed. Neurological:     General: No focal deficit present.     Mental Status: She is alert and oriented to person, place, and time. Mental status is at baseline.  Psychiatric:        Mood and Affect: Mood normal.        Behavior: Behavior normal.        Thought Content: Thought content normal.        Judgment: Judgment normal.     UC Treatments / Results  Labs (all labs ordered are listed, but only abnormal results are displayed) Labs Reviewed  POCT URINE PREGNANCY  CERVICOVAGINAL ANCILLARY ONLY    EKG   Radiology No results found.  Procedures Procedures (including critical care time)  Medications Ordered in UC Medications - No data to display  Initial Impression / Assessment and Plan / UC Course  I have reviewed the triage vital signs and the nursing notes.  Pertinent labs & imaging results that were available during my care of the patient were reviewed by me and considered in my medical decision making (see chart for details).     Patient's symptoms seem consistent with bacterial vaginosis, and patient does have recurrent issues with this and vaginal infection.  Will opt to treat with clindamycin due to recurrent bacterial vaginosis as patient was recently treated with metronidazole in November.  Cervicovaginal swab pending.  Will add or change treatment if necessary once her vaginal swab is resulted.  Patient provided with contact information for gynecology due to further evaluation of recurrent bacterial vaginosis.  Diflucan sent as patient reports yeast infections with antibiotic therapy.  Discussed return precautions.  Patient verbalized understanding and was agreeable with plan. Final Clinical Impressions(s) / UC Diagnoses   Final diagnoses:  Vaginal discharge  Screening examination  for venereal disease  Discharge Instructions      You are being treated for suspected bacterial vaginosis with clindamycin antibiotic.  Please take this medication with food to avoid diarrhea or stomach upset.  Vaginal swab is pending.  We will call if it is positive.  Please follow-up with provided contact information for gynecology for recurrent bacterial vaginosis.    ED Prescriptions     Medication Sig Dispense Auth. Provider   clindamycin (CLEOCIN) 150 MG capsule Take 2 capsules (300 mg total) by mouth every 12 (twelve) hours for 7 days. 28 capsule [place], [place] E, [place]   fluconazole (DIFLUCAN) 150 MG tablet Take 1 tablet (150 mg total) by mouth daily. Take at first of vaginal yeast 1 tablet [place], Michele Rockers, [place]      PDMP not reviewed this encounter.   Teodora Medici, [place] 05/23/21 [phone]

## 2021-05-24 LAB — CERVICOVAGINAL ANCILLARY ONLY
Bacterial Vaginitis (gardnerella): POSITIVE — AB
Candida Glabrata: NEGATIVE
Candida Vaginitis: NEGATIVE
Chlamydia: NEGATIVE
Comment: NEGATIVE
Comment: NEGATIVE
Comment: NEGATIVE
Comment: NEGATIVE
Comment: NEGATIVE
Comment: NORMAL
Neisseria Gonorrhea: NEGATIVE
Trichomonas: NEGATIVE

## 2021-06-05 ENCOUNTER — Ambulatory Visit: Payer: 59 | Admitting: Obstetrics and Gynecology

## 2021-06-22 ENCOUNTER — Other Ambulatory Visit: Payer: Self-pay

## 2021-06-22 ENCOUNTER — Ambulatory Visit
Admission: RE | Admit: 2021-06-22 | Discharge: 2021-06-22 | Disposition: A | Discharge status: Home / Self Care | Payer: 59 | Source: Ambulatory Visit | Attending: Physician Assistant | Admitting: Physician Assistant

## 2021-06-22 ENCOUNTER — Ambulatory Visit (INDEPENDENT_AMBULATORY_CARE_PROVIDER_SITE_OTHER): Payer: 59

## 2021-06-22 VITALS — BP 117/79 | HR 91 | Temp 99.0°F | Resp 18 | Ht 61.0 in | Wt 175.0 lb

## 2021-06-22 DIAGNOSIS — J069 Acute upper respiratory infection, unspecified: Secondary | ICD-10-CM

## 2021-06-22 DIAGNOSIS — M25512 Pain in left shoulder: Secondary | ICD-10-CM

## 2021-06-22 LAB — POCT INFLUENZA A/B
Influenza A, POC: NEGATIVE
Influenza B, POC: NEGATIVE

## 2021-06-22 MED ORDER — PREDNISONE 20 MG PO TABS: 40.0000 mg | ORAL_TABLET | Freq: Every day | ORAL | 0 refills | Status: AC

## 2021-06-22 NOTE — ED Provider Notes (Signed)
[name]    CSN: [id number] Arrival date & time: 06/22/21  1042      History   Chief Complaint Chief Complaint  Patient presents with   Appointment    Shoulder pain    HPI Miranda Schneider is a 26 y.o. female.   Patient here today for evaluation of left shoulder pain that started about a week ago. She reports that she has trouble even lifting a gallon of milk. She reports it feels as if there is something "out of place". She has not had any injury. She has tried tylenol without significant relief.   She also report some upper respiratory symptoms and had covid screen yesterday that was negative. She would like screening for flu.   The history is provided by the patient.   Past Medical History:  Diagnosis Date   Vitamin D deficiency     Patient Active Problem List   Diagnosis Date Noted   Abdominal discomfort 03/06/2016   Vaginal discharge 03/06/2016   Shoulder subluxation, right 03/26/2011    History reviewed. No pertinent surgical history.  OB History     Gravida  1   Para      Term      Preterm      AB      Living         SAB      IAB      Ectopic      Multiple      Live Births               Home Medications    Prior to Admission medications   Medication Sig Start Date End Date Taking? Authorizing Provider  predniSONE (DELTASONE) 20 MG tablet Take 2 tablets (40 mg total) by mouth daily with breakfast for 5 days. 06/22/21 06/27/21 Yes Tomi Bamberger, PA-C  fluconazole (DIFLUCAN) 150 MG tablet Take 1 tablet (150 mg total) by mouth daily. Take at first of vaginal yeast 05/23/21   Gustavus Bryant, FNP  ibuprofen (ADVIL) 600 MG tablet Take 1 tablet (600 mg total) by mouth every 6 (six) hours as needed. 02/08/21   Couture, Cortni S, PA-C  metroNIDAZOLE (FLAGYL) 500 MG tablet Take 1 tablet (500 mg total) by mouth 2 (two) times daily. Patient not taking: Reported on 05/23/2021 04/08/21   Merrilee Jansky, MD  omeprazole (PRILOSEC)  20 MG capsule Take 1 capsule (20 mg total) by mouth daily. 12/15/18 07/01/19  Georgetta Haber, NP    Family History Family History  Problem Relation Age of Onset   Diabetes Paternal Grandmother    Hypertension Paternal Grandmother    Diabetes Maternal Grandmother    Hypertension Paternal Grandfather    Heart disease Neg Hx    Stroke Neg Hx    Cancer Neg Hx     Social History Social History   Tobacco Use   Smoking status: Never   Smokeless tobacco: Never  Substance Use Topics   Alcohol use: No   Drug use: No     Allergies   Patient has no known allergies.   Review of Systems Review of Systems  Constitutional:  Negative for chills and fever.  HENT:  Positive for congestion, sinus pressure and sore throat. Negative for ear pain.   Eyes:  Negative for discharge and redness.  Respiratory:  Positive for cough. Negative for shortness of breath and wheezing.   Gastrointestinal:  Negative for abdominal pain, diarrhea, nausea and vomiting.  Musculoskeletal:  Positive for myalgias.    Physical Exam Triage Vital Signs ED Triage Vitals  Enc Vitals Group     BP      Pulse      Resp      Temp      Temp src      SpO2      Weight      Height      Head Circumference      Peak Flow      Pain Score      Pain Loc      Pain Edu?      Excl. in GC?    No data found.  Updated Vital Signs BP 117/79 (BP Location: Right Arm)    Pulse 91    Temp 99 F (37.2 C) (Oral)    Resp 18    Ht 5\' 1"  (1.549 m)    Wt 175 lb (79.4 kg)    LMP 06/12/2021    SpO2 97%    BMI 33.07 kg/m   Visual Acuity Right Eye Distance:   Left Eye Distance:   Bilateral Distance:    Right Eye Near:   Left Eye Near:    Bilateral Near:     Physical Exam Vitals and nursing note reviewed.  Constitutional:      General: She is not in acute distress.    Appearance: Normal appearance. She is not ill-appearing.  HENT:     Head: Normocephalic and atraumatic.     Nose: Congestion present.     Mouth/Throat:      Mouth: Mucous membranes are moist.     Pharynx: No oropharyngeal exudate or posterior oropharyngeal erythema.  Eyes:     Conjunctiva/sclera: Conjunctivae normal.  Cardiovascular:     Rate and Rhythm: Normal rate and regular rhythm.     Heart sounds: Normal heart sounds. No murmur heard. Pulmonary:     Effort: Pulmonary effort is normal. No respiratory distress.     Breath sounds: Normal breath sounds. No wheezing, rhonchi or rales.  Skin:    General: Skin is warm and dry.  Neurological:     Mental Status: She is alert.  Psychiatric:        Mood and Affect: Mood normal.        Thought Content: Thought content normal.     UC Treatments / Results  Labs (all labs ordered are listed, but only abnormal results are displayed) Labs Reviewed  POCT INFLUENZA A/B    EKG   Radiology DG Shoulder Left  Result Date: 06/22/2021 CLINICAL DATA:  A 26 year old female presents for evaluation of LEFT shoulder pain for 1 week. No reported history of injury. EXAM: LEFT SHOULDER - 2+ VIEW COMPARISON:  None FINDINGS: There is no evidence of fracture or dislocation. There is no evidence of arthropathy or other focal bone abnormality. Soft tissues are unremarkable. IMPRESSION: Negative. Electronically Signed   By: 26 M.D.   On: 06/22/2021 11:47    Procedures Procedures (including critical care time)  Medications Ordered in UC Medications - No data to display  Initial Impression / Assessment and Plan / UC Course  I have reviewed the triage vital signs and the nursing notes.  Pertinent labs & imaging results that were available during my care of the patient were reviewed by me and considered in my medical decision making (see chart for details).   Shoulder xray without fracture. Recommend follow up with ortho for further evaluation. Flu test negative. Suspect other viral  illness and recommended symptomatic treatment, increased fluids and rest. Encourage follow up with any further  concerns.    Final Clinical Impressions(s) / UC Diagnoses   Final diagnoses:  Acute upper respiratory infection  Acute pain of left shoulder   Discharge Instructions   None    ED Prescriptions     Medication Sig Dispense Auth. Provider   predniSONE (DELTASONE) 20 MG tablet Take 2 tablets (40 mg total) by mouth daily with breakfast for 5 days. 10 tablet Tomi Bamberger, PA-C      PDMP not reviewed this encounter.   Tomi Bamberger, PA-C 06/22/21 [phone]

## 2021-06-22 NOTE — ED Triage Notes (Signed)
Patient c/o left shoulder pain x 1 week, having a hard time lifting a gallon of milk, states it "feels out of place."  No apparent injury.  Patient has been taken Tylenol extra strength.  Also patient states she feels bad, SARS COVID test yesterday was negative.

## 2021-06-26 ENCOUNTER — Ambulatory Visit: Payer: 59 | Admitting: Obstetrics and Gynecology

## 2021-06-26 DIAGNOSIS — Z0289 Encounter for other administrative examinations: Secondary | ICD-10-CM

## 2021-09-04 ENCOUNTER — Ambulatory Visit
Admission: RE | Admit: 2021-09-04 | Discharge: 2021-09-04 | Disposition: A | Discharge status: Home / Self Care | Payer: 59 | Source: Ambulatory Visit | Attending: Urgent Care | Admitting: Urgent Care

## 2021-09-04 VITALS — BP 106/65 | HR 64 | Temp 98.2°F | Resp 17

## 2021-09-04 DIAGNOSIS — N760 Acute vaginitis: Secondary | ICD-10-CM | POA: Diagnosis present

## 2021-09-04 DIAGNOSIS — N898 Other specified noninflammatory disorders of vagina: Secondary | ICD-10-CM | POA: Insufficient documentation

## 2021-09-04 LAB — POCT URINALYSIS DIP (MANUAL ENTRY)
Bilirubin, UA: NEGATIVE
Blood, UA: NEGATIVE
Glucose, UA: NEGATIVE mg/dL
Ketones, POC UA: NEGATIVE mg/dL
Leukocytes, UA: NEGATIVE
Nitrite, UA: NEGATIVE
Protein Ur, POC: NEGATIVE mg/dL
Spec Grav, UA: 1.02 (ref 1.010–1.025)
Urobilinogen, UA: 0.2 E.U./dL
pH, UA: 5.5 (ref 5.0–8.0)

## 2021-09-04 LAB — POCT URINE PREGNANCY: Preg Test, Ur: NEGATIVE

## 2021-09-04 MED ORDER — METRONIDAZOLE 500 MG PO TABS: 500.0000 mg | ORAL_TABLET | Freq: Two times a day (BID) | ORAL | 0 refills | Status: DC

## 2021-09-04 MED ORDER — FLUCONAZOLE 150 MG PO TABS: 150.0000 mg | ORAL_TABLET | ORAL | 0 refills | Status: DC

## 2021-09-04 NOTE — Discharge Instructions (Addendum)
Make sure you hydrate very well with plain water and a quantity of 80 ounces of water a day.  Please limit drinks that are considered urinary irritants such as soda, sweet tea, coffee, energy drinks, alcohol.  These can worsen your urinary and genital symptoms but also be the source of them.  I will let you know about your urine culture and vaginal swab results through MyChart to see if we need to prescribe or change your antibiotics based off of those results.  

## 2021-09-04 NOTE — ED Triage Notes (Signed)
2-3 h/o vaginal itching and odor. Notes increased urinary frequency and urgency. Pt requesting STD testing. ?

## 2021-09-04 NOTE — ED Provider Notes (Addendum)
?[facility] ? ? ?MRN: [id number] DOB: May 13, 1996 ? ?Subjective:  ? ?Amy A Wieser is a 26 y.o. female presenting for 3-day history of acute onset recurrent vaginal discharge, vaginal itching, urinary urgency, urinary frequency.  Patient would like to make sure she gets checked for sexually transmitted infections as well. Has a history of bacterial vaginosis, last episode was 05/23/2021.  She does have unprotected sex.  Denies fever, nausea, vomiting, dysuria, genital rash.  Patient does not hydrate with much water.  She does drink a lot of urinary irritants including sweet tea, soda.  Limits her coffee. ? ?No current facility-administered medications for this encounter. ? ?Current Outpatient Medications:  ?  fluconazole (DIFLUCAN) 150 MG tablet, Take 1 tablet (150 mg total) by mouth daily. Take at first of vaginal yeast, Disp: 1 tablet, Rfl: 0 ?  ibuprofen (ADVIL) 600 MG tablet, Take 1 tablet (600 mg total) by mouth every 6 (six) hours as needed., Disp: 30 tablet, Rfl: 0 ?  metroNIDAZOLE (FLAGYL) 500 MG tablet, Take 1 tablet (500 mg total) by mouth 2 (two) times daily. (Patient not taking: Reported on 05/23/2021), Disp: 14 tablet, Rfl: 0  ? ?No Known Allergies ? ?Past Medical History:  ?Diagnosis Date  ? Vitamin D deficiency   ?  ? ?History reviewed. No pertinent surgical history. ? ?Family History  ?Problem Relation Age of Onset  ? Diabetes Paternal Grandmother   ? Hypertension Paternal Grandmother   ? Diabetes Maternal Grandmother   ? Hypertension Paternal Grandfather   ? Heart disease Neg Hx   ? Stroke Neg Hx   ? Cancer Neg Hx   ? ? ?Social History  ? ?Tobacco Use  ? Smoking status: Never  ? Smokeless tobacco: Never  ?Substance Use Topics  ? Alcohol use: No  ? Drug use: No  ? ? ?ROS ? ? ?Objective:  ? ?Vitals: ?BP 106/65 (BP Location: Left Arm)   Pulse 64   Temp 98.2 ?F (36.8 ?C) (Oral)   Resp 17   LMP 08/25/2021 (Approximate)   SpO2 95%  ? ?Physical Exam ?Constitutional:   ?   General: She  is not in acute distress. ?   Appearance: Normal appearance. She is well-developed. She is not ill-appearing, toxic-appearing or diaphoretic.  ?HENT:  ?   Head: Normocephalic and atraumatic.  ?   Nose: Nose normal.  ?   Mouth/Throat:  ?   Mouth: Mucous membranes are moist.  ?Eyes:  ?   General: No scleral icterus.    ?   Right eye: No discharge.     ?   Left eye: No discharge.  ?   Extraocular Movements: Extraocular movements intact.  ?   Conjunctiva/sclera: Conjunctivae normal.  ?Cardiovascular:  ?   Rate and Rhythm: Normal rate.  ?Pulmonary:  ?   Effort: Pulmonary effort is normal.  ?Abdominal:  ?   General: Bowel sounds are normal. There is no distension.  ?   Palpations: Abdomen is soft. There is no mass.  ?   Tenderness: There is abdominal tenderness (mild, right sided). There is no right CVA tenderness, left CVA tenderness, guarding or rebound.  ?Skin: ?   General: Skin is warm and dry.  ?Neurological:  ?   General: No focal deficit present.  ?   Mental Status: She is alert and oriented to person, place, and time.  ?Psychiatric:     ?   Mood and Affect: Mood normal.     ?   Behavior: Behavior normal.     ?  Thought Content: Thought content normal.     ?   Judgment: Judgment normal.  ? ?Results for orders placed or performed during the hospital encounter of 09/04/21 (from the past 24 hour(s))  ?POCT urine pregnancy     Status: None  ? Collection Time: 09/04/21  2:57 PM  ?Result Value Ref Range  ? Preg Test, Ur Negative Negative  ?POCT urinalysis dipstick     Status: None  ? Collection Time: 09/04/21  2:57 PM  ?Result Value Ref Range  ? Color, UA yellow yellow  ? Clarity, UA clear clear  ? Glucose, UA negative negative mg/dL  ? Bilirubin, UA negative negative  ? Ketones, POC UA negative negative mg/dL  ? Spec Grav, UA 1.020 1.010 - 1.025  ? Blood, UA negative negative  ? pH, UA 5.5 5.0 - 8.0  ? Protein Ur, POC negative negative mg/dL  ? Urobilinogen, UA 0.2 0.2 or 1.0 E.U./dL  ? Nitrite, UA Negative Negative  ?  Leukocytes, UA Negative Negative  ? ? ?Assessment and Plan :  ? ?PDMP not reviewed this encounter. ? ?1. Acute vaginitis   ?2. Vaginal odor   ?3. Vaginal itching   ? ? ?Recommended covering for recurrent bacterial vaginosis and concurrent yeast vaginitis.  I will be using Flagyl and fluconazole to help patient with this.  Labs pending, will treat as appropriate otherwise.  Low suspicion for PID.  Counseled patient on potential for adverse effects with medications prescribed/recommended today, ER and return-to-clinic precautions discussed, patient verbalized understanding. ?  ?Wallis Bamberg, PA-C ?09/04/21 1501 ? ?

## 2021-09-05 LAB — CERVICOVAGINAL ANCILLARY ONLY
Bacterial Vaginitis (gardnerella): POSITIVE — AB
Candida Glabrata: NEGATIVE
Candida Vaginitis: NEGATIVE
Chlamydia: NEGATIVE
Comment: NEGATIVE
Comment: NEGATIVE
Comment: NEGATIVE
Comment: NEGATIVE
Comment: NEGATIVE
Comment: NORMAL
Neisseria Gonorrhea: NEGATIVE
Trichomonas: NEGATIVE

## 2021-10-07 ENCOUNTER — Ambulatory Visit: Payer: 59

## 2021-10-11 ENCOUNTER — Ambulatory Visit
Admission: RE | Admit: 2021-10-11 | Discharge: 2021-10-11 | Disposition: A | Discharge status: Home / Self Care | Payer: 59 | Source: Ambulatory Visit | Attending: Urgent Care | Admitting: Urgent Care

## 2021-10-11 VITALS — BP 102/59 | HR 79 | Temp 98.3°F | Resp 18

## 2021-10-11 DIAGNOSIS — R102 Pelvic and perineal pain: Secondary | ICD-10-CM | POA: Diagnosis present

## 2021-10-11 DIAGNOSIS — N760 Acute vaginitis: Secondary | ICD-10-CM | POA: Insufficient documentation

## 2021-10-11 LAB — POCT URINALYSIS DIP (MANUAL ENTRY)
Bilirubin, UA: NEGATIVE
Blood, UA: NEGATIVE
Glucose, UA: NEGATIVE mg/dL
Ketones, POC UA: NEGATIVE mg/dL
Leukocytes, UA: NEGATIVE
Nitrite, UA: NEGATIVE
Protein Ur, POC: NEGATIVE mg/dL
Spec Grav, UA: 1.02 (ref 1.010–1.025)
Urobilinogen, UA: 0.2 E.U./dL
pH, UA: 7 (ref 5.0–8.0)

## 2021-10-11 LAB — POCT URINE PREGNANCY: Preg Test, Ur: NEGATIVE

## 2021-10-11 MED ORDER — FLUCONAZOLE 150 MG PO TABS: 150.0000 mg | ORAL_TABLET | Freq: Once | ORAL | 0 refills | Status: AC

## 2021-10-11 MED ORDER — TINIDAZOLE 500 MG PO TABS: 2.0000 g | ORAL_TABLET | Freq: Every day | ORAL | 0 refills | Status: AC

## 2021-10-11 NOTE — ED Triage Notes (Signed)
Pt c/o frequency, vaginal discharge, abd pain, pelvic pain   Onset ~ 2 weeks ago

## 2021-10-11 NOTE — ED Provider Notes (Signed)
[name]    CSN: [id number] Arrival date & time: 10/11/21  1341      History   Chief Complaint Chief Complaint  Patient presents with   Vaginal Discharge    Entered by patient    HPI Miranda Schneider is a 26 y.o. female.   Pleasant 25yo female complains of roughly two weeks of vaginal discharge and pelvic pressure. States her normal menses usually only last 5 days, but her last one on 5/16 lasted  8 days. She is not currently on birth control. She reports mild pelvic tenderness, but no severe pain. Tylenol helps. No n/v/d. No fever or flank pain. Hx of reccurent yeast and BV and states this seems to occur around or after her menstrual period. Feels symptoms are similar.    Vaginal Discharge  Past Medical History:  Diagnosis Date   Vitamin D deficiency     Patient Active Problem List   Diagnosis Date Noted   Abdominal discomfort 03/06/2016   Vaginal discharge 03/06/2016   Shoulder subluxation, right 03/26/2011    History reviewed. No pertinent surgical history.  OB History     Gravida  1   Para      Term      Preterm      AB      Living         SAB      IAB      Ectopic      Multiple      Live Births               Home Medications    Prior to Admission medications   Medication Sig Start Date End Date Taking? Authorizing Provider  tinidazole (TINDAMAX) 500 MG tablet Take 4 tablets (2,000 mg total) by mouth daily with breakfast for 2 days. 10/11/21 10/13/21 Yes Sandro Burgo L, PA  ibuprofen (ADVIL) 600 MG tablet Take 1 tablet (600 mg total) by mouth every 6 (six) hours as needed. 02/08/21   Couture, Cortni S, PA-C  omeprazole (PRILOSEC) 20 MG capsule Take 1 capsule (20 mg total) by mouth daily. 12/15/18 07/01/19  Georgetta Haber, NP    Family History Family History  Problem Relation Age of Onset   Diabetes Paternal Grandmother    Hypertension Paternal Grandmother    Diabetes Maternal Grandmother    Hypertension Paternal  Grandfather    Heart disease Neg Hx    Stroke Neg Hx    Cancer Neg Hx     Social History Social History   Tobacco Use   Smoking status: Never   Smokeless tobacco: Never  Substance Use Topics   Alcohol use: No   Drug use: No     Allergies   Patient has no known allergies.   Review of Systems Review of Systems  Genitourinary:  Positive for vaginal discharge.  As per HPI  Physical Exam Triage Vital Signs ED Triage Vitals [10/11/21 1441]  Enc Vitals Group     BP (!) 102/59     Pulse Rate 79     Resp 18     Temp 98.3 F (36.8 C)     Temp Source Oral     SpO2 98 %     Weight      Height      Head Circumference      Peak Flow      Pain Score 0     Pain Loc      Pain Edu?  Excl. in GC?    No data found.  Updated Vital Signs BP (!) 102/59 (BP Location: Left Arm)   Pulse 79   Temp 98.3 F (36.8 C) (Oral)   Resp 18   SpO2 98%   Visual Acuity Right Eye Distance:   Left Eye Distance:   Bilateral Distance:    Right Eye Near:   Left Eye Near:    Bilateral Near:     Physical Exam Vitals and nursing note reviewed.  Constitutional:      Appearance: She is well-developed and normal weight.  HENT:     Head: Normocephalic.  Cardiovascular:     Rate and Rhythm: Normal rate.     Heart sounds: No murmur heard. Pulmonary:     Effort: Pulmonary effort is normal. No respiratory distress.     Breath sounds: No wheezing.  Abdominal:     General: Abdomen is flat. Bowel sounds are normal. There is no distension. There are no signs of injury.     Palpations: Abdomen is soft. There is no shifting dullness, fluid wave, hepatomegaly, splenomegaly, mass or pulsatile mass.     Tenderness: There is no abdominal tenderness. There is no right CVA tenderness, left CVA tenderness, guarding or rebound. Negative signs include Murphy's sign, Rovsing's sign, McBurney's sign, psoas sign and obturator sign.     Hernia: No hernia is present.  Genitourinary:    General: Normal  vulva.     Pubic Area: No rash or pubic lice.      Labia:        Right: No rash, tenderness, lesion or injury.        Left: No rash, tenderness, lesion or injury.      Urethra: No prolapse, urethral pain, urethral swelling or urethral lesion.     Vagina: No signs of injury and foreign body. Vaginal discharge present. No erythema, tenderness, bleeding, lesions or prolapsed vaginal walls.     Cervix: No cervical motion tenderness, discharge, friability, lesion, erythema or eversion.     Uterus: Normal. Not deviated, not enlarged, not fixed, not tender and no uterine prolapse.      Adnexa: Right adnexa normal.       Right: No mass, tenderness or fullness.         Left: Tenderness present. No mass or fullness.       Rectum: Normal.     Comments: Minimal tenderness over L adnexa/ovary without rebound or guarding. No palpable mass.  Neurological:     Mental Status: She is alert.     UC Treatments / Results  Labs (all labs ordered are listed, but only abnormal results are displayed) Labs Reviewed  CERVICOVAGINAL ANCILLARY ONLY   POCT URINALYSIS DIP (MANUAL ENTRY)  POCT URINE PREGNANCY    EKG   Radiology No results found.  Procedures Procedures (including critical care time)  Medications Ordered in UC Medications - No data to display  Initial Impression / Assessment and Plan / UC Course  I have reviewed the triage vital signs and the nursing notes.  Pertinent labs & imaging results that were available during my care of the patient were reviewed by me and considered in my medical decision making (see chart for details).     Vaginitis - Hx of BV and yeast. Aptima swab obtained, will change to Tindamax given recurrent use of flagyl in the past. Recommend f/u with gyn for further testing for causes of recurrence. Diflucan x 1 Pelvic pain - mild tenderness to L ovary,  suggesting possible cyst given recent menses. No clinical concern for PID, negative chandelier sign. RTC  precautions discussed. F/U with gyne if changes in menses persist or cause concern.  Final Clinical Impressions(s) / UC Diagnoses   Final diagnoses:  Vaginitis and vulvovaginitis     Discharge Instructions      You were tested today for gonorrhea, chlamydia, trichomonas, BV, and yeast. We will call you with the results of your test once received. Start tinidazole - 4 tabs in a single dose, x 2 days. Fluconazole as needed. Follow up with gynecology for further testing Please avoid all forms of intercourse until test results received, and if positive for any STI, all partners will need to complete entire course of antibiotics prior to resuming. As always, practice safer sexual practices by using protection each and every time, and limiting number of partners.      ED Prescriptions     Medication Sig Dispense Auth. Provider   tinidazole (TINDAMAX) 500 MG tablet Take 4 tablets (2,000 mg total) by mouth daily with breakfast for 2 days. 8 tablet Daviana Haymaker L, PA   fluconazole (DIFLUCAN) 150 MG tablet Take 1 tablet (150 mg total) by mouth once for 1 dose. 1 tablet Ariel Wingrove L, [place]      PDMP not reviewed this encounter.   Maretta BeesCrain, Raia Amico L, [place] 10/12/21 2209

## 2021-10-11 NOTE — Discharge Instructions (Addendum)
You were tested today for gonorrhea, chlamydia, trichomonas, BV, and yeast. We will call you with the results of your test once received. Start tinidazole - 4 tabs in a single dose, x 2 days. Fluconazole as needed. Follow up with gynecology for further testing Please avoid all forms of intercourse until test results received, and if positive for any STI, all partners will need to complete entire course of antibiotics prior to resuming. As always, practice safer sexual practices by using protection each and every time, and limiting number of partners.

## 2021-10-12 LAB — CERVICOVAGINAL ANCILLARY ONLY
Bacterial Vaginitis (gardnerella): POSITIVE — AB
Candida Glabrata: NEGATIVE
Candida Vaginitis: NEGATIVE
Chlamydia: NEGATIVE
Comment: NEGATIVE
Comment: NEGATIVE
Comment: NEGATIVE
Comment: NEGATIVE
Comment: NEGATIVE
Comment: NORMAL
Neisseria Gonorrhea: NEGATIVE
Trichomonas: NEGATIVE

## 2022-01-18 ENCOUNTER — Ambulatory Visit
Admission: EM | Admit: 2022-01-18 | Discharge: 2022-01-18 | Disposition: A | Discharge status: Home / Self Care | Payer: 59 | Attending: Physician Assistant | Admitting: Physician Assistant

## 2022-01-18 ENCOUNTER — Encounter: Payer: Self-pay | Admitting: Emergency Medicine

## 2022-01-18 DIAGNOSIS — N898 Other specified noninflammatory disorders of vagina: Secondary | ICD-10-CM | POA: Insufficient documentation

## 2022-01-18 DIAGNOSIS — Z3490 Encounter for supervision of normal pregnancy, unspecified, unspecified trimester: Secondary | ICD-10-CM

## 2022-01-18 DIAGNOSIS — J069 Acute upper respiratory infection, unspecified: Secondary | ICD-10-CM

## 2022-01-18 DIAGNOSIS — Z3201 Encounter for pregnancy test, result positive: Secondary | ICD-10-CM

## 2022-01-18 DIAGNOSIS — M549 Dorsalgia, unspecified: Secondary | ICD-10-CM | POA: Diagnosis not present

## 2022-01-18 DIAGNOSIS — O99891 Other specified diseases and conditions complicating pregnancy: Secondary | ICD-10-CM | POA: Insufficient documentation

## 2022-01-18 DIAGNOSIS — Z3A00 Weeks of gestation of pregnancy not specified: Secondary | ICD-10-CM | POA: Insufficient documentation

## 2022-01-18 DIAGNOSIS — U071 COVID-19: Secondary | ICD-10-CM | POA: Diagnosis not present

## 2022-01-18 LAB — POCT URINALYSIS DIP (MANUAL ENTRY)
Bilirubin, UA: NEGATIVE
Blood, UA: NEGATIVE
Glucose, UA: NEGATIVE mg/dL
Ketones, POC UA: NEGATIVE mg/dL
Leukocytes, UA: NEGATIVE
Nitrite, UA: NEGATIVE
Protein Ur, POC: NEGATIVE mg/dL
Spec Grav, UA: 1.02 (ref 1.010–1.025)
Urobilinogen, UA: 0.2 E.U./dL
pH, UA: 7 (ref 5.0–8.0)

## 2022-01-18 LAB — POCT URINE PREGNANCY: Preg Test, Ur: POSITIVE — AB

## 2022-01-18 NOTE — ED Triage Notes (Signed)
Pt is present today with c/o back pain, vaginal discharge. Pt sx started one week ago   Pt states that she also has congestion, sore throat, HA, diarrhea, and nausea. Pt sx started Sunday. Pt states that she did test positive covid

## 2022-01-18 NOTE — ED Provider Notes (Signed)
[name]    CSN: [id number] Arrival date & time: 01/18/22  1014      History   Chief Complaint Chief Complaint  Patient presents with   Vaginal Discharge   Back Pain   Fever   Sore Throat    HPI Miranda Schneider is a 26 y.o. female.   Patient here today for evaluation of back pain, vaginal discharge that started a week ago. She reports she has had some nausea. She does not report any dysuria.   She also reports congestion, sore throat, headache and diarrhea. She states that symptoms started 4 days ago. She did test positive for covid at home.   The history is provided by the patient.    Past Medical History:  Diagnosis Date   Vitamin D deficiency     Patient Active Problem List   Diagnosis Date Noted   Abdominal discomfort 03/06/2016   Vaginal discharge 03/06/2016   Shoulder subluxation, right 03/26/2011    History reviewed. No pertinent surgical history.  OB History     Gravida  1   Para      Term      Preterm      AB      Living         SAB      IAB      Ectopic      Multiple      Live Births               Home Medications    Prior to Admission medications   Medication Sig Start Date End Date Taking? Authorizing Provider  ibuprofen (ADVIL) 600 MG tablet Take 1 tablet (600 mg total) by mouth every 6 (six) hours as needed. 02/08/21   Couture, Cortni S, PA-C  omeprazole (PRILOSEC) 20 MG capsule Take 1 capsule (20 mg total) by mouth daily. 12/15/18 07/01/19  Georgetta Haber, NP    Family History Family History  Problem Relation Age of Onset   Diabetes Paternal Grandmother    Hypertension Paternal Grandmother    Diabetes Maternal Grandmother    Hypertension Paternal Grandfather    Heart disease Neg Hx    Stroke Neg Hx    Cancer Neg Hx     Social History Social History   Tobacco Use   Smoking status: Never   Smokeless tobacco: Never  Substance Use Topics   Alcohol use: No   Drug use: No     Allergies    Patient has no known allergies.   Review of Systems Review of Systems  Constitutional:  Negative for chills and fever.  HENT:  Positive for congestion, sinus pressure and sore throat. Negative for ear pain.   Eyes:  Negative for discharge and redness.  Respiratory:  Positive for cough. Negative for shortness of breath and wheezing.   Gastrointestinal:  Positive for diarrhea and nausea. Negative for abdominal pain and vomiting.  Genitourinary:  Positive for vaginal discharge.  Musculoskeletal:  Positive for back pain.  Neurological:  Positive for headaches.     Physical Exam Triage Vital Signs ED Triage Vitals  Enc Vitals Group     BP      Pulse      Resp      Temp      Temp src      SpO2      Weight      Height      Head Circumference      Peak Flow  Pain Score      Pain Loc      Pain Edu?      Excl. in GC?    No data found.  Updated Vital Signs BP 131/87   Pulse 74   Temp 98.6 F (37 C)   Resp 18   LMP 12/11/2021 (Approximate)   SpO2 98%      Physical Exam Vitals and nursing note reviewed.  Constitutional:      General: She is not in acute distress.    Appearance: Normal appearance. She is not ill-appearing.  HENT:     Head: Normocephalic and atraumatic.     Nose: Congestion present.  Eyes:     Conjunctiva/sclera: Conjunctivae normal.  Cardiovascular:     Rate and Rhythm: Normal rate.  Pulmonary:     Effort: Pulmonary effort is normal. No respiratory distress.  Skin:    General: Skin is warm and dry.  Neurological:     Mental Status: She is alert.  Psychiatric:        Mood and Affect: Mood normal.        Thought Content: Thought content normal.      UC Treatments / Results  Labs (all labs ordered are listed, but only abnormal results are displayed) Labs Reviewed  SARS CORONAVIRUS 2 (TAT 6-24 HRS) - Abnormal; Notable for the following components:      Result Value   SARS Coronavirus 2 POSITIVE (*)    All other components within  normal limits  POCT URINE PREGNANCY - Abnormal; Notable for the following components:   Preg Test, Ur Positive (*)    All other components within normal limits  POCT URINALYSIS DIP (MANUAL ENTRY)  CERVICOVAGINAL ANCILLARY ONLY    EKG   Radiology No results found.  Procedures Procedures (including critical care time)  Medications Ordered in UC Medications - No data to display  Initial Impression / Assessment and Plan / UC Course  I have reviewed the triage vital signs and the nursing notes.  Pertinent labs & imaging results that were available during my care of the patient were reviewed by me and considered in my medical decision making (see chart for details).    Discussed positive pregnancy test in office. Patient will follow up with specialist. Will order STD screening as well. Covid positive- recommend symptomatic treatment and follow up with any further concerns or worsening symptoms.   Final Clinical Impressions(s) / UC Diagnoses   Final diagnoses:  Acute upper respiratory infection  Pregnancy, unspecified gestational age  Vaginal discharge   Discharge Instructions   None    ED Prescriptions   None    PDMP not reviewed this encounter.   Tomi Bamberger, PA-C 01/19/22 [phone]

## 2022-01-19 ENCOUNTER — Encounter: Payer: Self-pay | Admitting: Physician Assistant

## 2022-01-19 LAB — SARS CORONAVIRUS 2 (TAT 6-24 HRS): SARS Coronavirus 2: POSITIVE — AB

## 2022-01-22 ENCOUNTER — Telehealth (HOSPITAL_COMMUNITY): Payer: Self-pay | Admitting: Emergency Medicine

## 2022-01-22 LAB — CERVICOVAGINAL ANCILLARY ONLY
Bacterial Vaginitis (gardnerella): POSITIVE — AB
Candida Glabrata: NEGATIVE
Candida Vaginitis: NEGATIVE
Chlamydia: NEGATIVE
Comment: NEGATIVE
Comment: NEGATIVE
Comment: NEGATIVE
Comment: NEGATIVE
Comment: NEGATIVE
Comment: NORMAL
Neisseria Gonorrhea: NEGATIVE
Trichomonas: NEGATIVE

## 2022-01-22 MED ORDER — METRONIDAZOLE 0.75 % VA GEL: 1.0000 | Freq: Every day | VAGINAL | 0 refills | Status: AC

## 2022-02-01 DIAGNOSIS — Z8742 Personal history of other diseases of the female genital tract: Secondary | ICD-10-CM | POA: Insufficient documentation

## 2022-02-01 DIAGNOSIS — Z3480 Encounter for supervision of other normal pregnancy, unspecified trimester: Secondary | ICD-10-CM | POA: Insufficient documentation

## 2022-02-01 DIAGNOSIS — F1290 Cannabis use, unspecified, uncomplicated: Secondary | ICD-10-CM | POA: Insufficient documentation

## 2022-02-06 DIAGNOSIS — R7989 Other specified abnormal findings of blood chemistry: Secondary | ICD-10-CM | POA: Insufficient documentation

## 2022-02-16 ENCOUNTER — Telehealth: Payer: Self-pay | Admitting: Emergency Medicine

## 2022-02-16 NOTE — Telephone Encounter (Signed)
Pt is present today for work note from 9/7

## 2022-02-19 ENCOUNTER — Ambulatory Visit
Admission: RE | Admit: 2022-02-19 | Discharge: 2022-02-19 | Disposition: A | Discharge status: Home / Self Care | Payer: 59 | Source: Ambulatory Visit | Attending: Urgent Care | Admitting: Urgent Care

## 2022-02-19 VITALS — BP 130/96 | HR 80 | Temp 98.3°F | Resp 16

## 2022-02-19 DIAGNOSIS — N760 Acute vaginitis: Secondary | ICD-10-CM | POA: Diagnosis not present

## 2022-02-19 DIAGNOSIS — L739 Follicular disorder, unspecified: Secondary | ICD-10-CM | POA: Diagnosis not present

## 2022-02-19 DIAGNOSIS — Z3A09 9 weeks gestation of pregnancy: Secondary | ICD-10-CM | POA: Diagnosis not present

## 2022-02-19 DIAGNOSIS — B9689 Other specified bacterial agents as the cause of diseases classified elsewhere: Secondary | ICD-10-CM | POA: Diagnosis not present

## 2022-02-19 MED ORDER — MUPIROCIN 2 % EX OINT: 1.0000 | TOPICAL_OINTMENT | Freq: Three times a day (TID) | CUTANEOUS | 0 refills | Status: DC

## 2022-02-19 MED ORDER — FLUCONAZOLE 150 MG PO TABS: 150.0000 mg | ORAL_TABLET | ORAL | 0 refills | Status: DC

## 2022-02-19 MED ORDER — METRONIDAZOLE 500 MG PO TABS: 500.0000 mg | ORAL_TABLET | Freq: Two times a day (BID) | ORAL | 0 refills | Status: DC

## 2022-02-19 NOTE — ED Provider Notes (Signed)
[facility]  Note:  This document was prepared using Systems analyst and may include unintentional dictation errors.  MRN: [id number] DOB: 02/21/1996  Subjective:   Miranda Schneider is a 26 y.o. female presenting for 2-week history of persistent vaginal discharge and irritation.  Patient has a history of BV and would like to be treated for this.  She tested positive in September and underwent a course of MetroGel which did not work for her.  She is [redacted] weeks pregnant but is having an abortion today.  She would also like oral fluconazole as she regularly gets yeast infections when she takes antibiotics.  Has a slightly painful bump over the right labia.  Reports that she has been picking at it as it can itch as well.  No fever, drainage of pus or bleeding.  Patient does use razors to shave the area.  No current facility-administered medications for this encounter.  Current Outpatient Medications:    ibuprofen (ADVIL) 600 MG tablet, Take 1 tablet (600 mg total) by mouth every 6 (six) hours as needed., Disp: 30 tablet, Rfl: 0   No Known Allergies  Past Medical History:  Diagnosis Date   Vitamin D deficiency      History reviewed. No pertinent surgical history.  Family History  Problem Relation Age of Onset   Diabetes Paternal Grandmother    Hypertension Paternal Grandmother    Diabetes Maternal Grandmother    Hypertension Paternal Grandfather    Heart disease Neg Hx    Stroke Neg Hx    Cancer Neg Hx     Social History   Tobacco Use   Smoking status: Never   Smokeless tobacco: Never  Substance Use Topics   Alcohol use: No   Drug use: No    ROS   Objective:   Vitals: BP (!) 130/96 (BP Location: Right Arm)   Pulse 80   Temp 98.3 F (36.8 C) (Oral)   Resp 16   LMP 12/11/2021 (Approximate)   SpO2 98%   Physical Exam Exam conducted with a chaperone present (RN Denhora).  Constitutional:      General: She is not in acute  distress.    Appearance: Normal appearance. She is well-developed. She is not ill-appearing, toxic-appearing or diaphoretic.  HENT:     Head: Normocephalic and atraumatic.     Nose: Nose normal.     Mouth/Throat:     Mouth: Mucous membranes are moist.     Pharynx: Oropharynx is clear.  Eyes:     General: No scleral icterus.       Right eye: No discharge.        Left eye: No discharge.     Extraocular Movements: Extraocular movements intact.     Conjunctiva/sclera: Conjunctivae normal.  Cardiovascular:     Rate and Rhythm: Normal rate.  Pulmonary:     Effort: Pulmonary effort is normal.  Abdominal:     General: Bowel sounds are normal. There is no distension.     Palpations: Abdomen is soft. There is no mass.     Tenderness: There is no abdominal tenderness. There is no right CVA tenderness, left CVA tenderness, guarding or rebound.  Genitourinary:   Skin:    General: Skin is warm and dry.  Neurological:     General: No focal deficit present.     Mental Status: She is alert and oriented to person, place, and time.  Psychiatric:        Mood  and Affect: Mood normal.        Behavior: Behavior normal.        Thought Content: Thought content normal.        Judgment: Judgment normal.       Assessment and Plan :   PDMP not reviewed this encounter.  1. Bacterial vaginosis   2. Acute vaginitis   3. Folliculitis   4. [redacted] weeks gestation of pregnancy     We will treat empirically for bacterial vaginosis with Flagyl and acute vaginitis with fluconazole.  Recommended mupirocin ointment for folliculitis and to avoid using razors for now.  Labs pending. Counseled patient on potential for adverse effects with medications prescribed/recommended today, ER and return-to-clinic precautions discussed, patient verbalized understanding.    Jaynee Eagles, PA-C 02/19/22 1017

## 2022-02-19 NOTE — ED Triage Notes (Signed)
Patient presents to [facility] for vaginal discharge x 1-2 weeks. Hx of BV. States she used boric acid capsules but since she is pregnant she has been using the gel instead which does not provide relief. Also concerned with bump on vaginal area. She thinks it is an ingrown hair.

## 2022-02-20 LAB — CERVICOVAGINAL ANCILLARY ONLY
Bacterial Vaginitis (gardnerella): POSITIVE — AB
Candida Glabrata: NEGATIVE
Candida Vaginitis: NEGATIVE
Chlamydia: NEGATIVE
Comment: NEGATIVE
Comment: NEGATIVE
Comment: NEGATIVE
Comment: NEGATIVE
Comment: NEGATIVE
Comment: NORMAL
Neisseria Gonorrhea: NEGATIVE
Trichomonas: NEGATIVE

## 2022-04-24 ENCOUNTER — Ambulatory Visit
Admission: EM | Admit: 2022-04-24 | Discharge: 2022-04-24 | Disposition: A | Discharge status: Home / Self Care | Payer: 59 | Attending: Internal Medicine | Admitting: Internal Medicine

## 2022-04-24 DIAGNOSIS — Z113 Encounter for screening for infections with a predominantly sexual mode of transmission: Secondary | ICD-10-CM | POA: Diagnosis present

## 2022-04-24 DIAGNOSIS — N611 Abscess of the breast and nipple: Secondary | ICD-10-CM | POA: Diagnosis present

## 2022-04-24 DIAGNOSIS — N760 Acute vaginitis: Secondary | ICD-10-CM | POA: Diagnosis present

## 2022-04-24 LAB — POCT URINALYSIS DIP (MANUAL ENTRY)
Bilirubin, UA: NEGATIVE
Blood, UA: NEGATIVE
Glucose, UA: NEGATIVE mg/dL
Ketones, POC UA: NEGATIVE mg/dL
Leukocytes, UA: NEGATIVE
Nitrite, UA: NEGATIVE
Protein Ur, POC: NEGATIVE mg/dL
Spec Grav, UA: 1.025 (ref 1.010–1.025)
Urobilinogen, UA: 0.2 E.U./dL
pH, UA: 5.5 (ref 5.0–8.0)

## 2022-04-24 LAB — POCT URINE PREGNANCY: Preg Test, Ur: NEGATIVE

## 2022-04-24 MED ORDER — SULFAMETHOXAZOLE-TRIMETHOPRIM 800-160 MG PO TABS: 2.0000 | ORAL_TABLET | Freq: Two times a day (BID) | ORAL | 0 refills | Status: AC

## 2022-04-24 MED ORDER — FLUCONAZOLE 150 MG PO TABS: ORAL_TABLET | ORAL | 0 refills | Status: DC

## 2022-04-24 NOTE — Discharge Instructions (Addendum)
To treat the abscess in your left nipple, please begin taking Bactrim.  Please take 2 tablets in the morning and 2 tablets in the evening every day for full 10 days.  Please be sure that you finish all as prescribed to be sure the infection is completely eradicated.  Because you will be on antibiotics for 10 days, it is very likely that you will develop a vaginal yeast infection.  I sent a prescription for Diflucan to your pharmacy.  Please take the first tablet on the third or fourth day  The results of your vaginal swab test which screens for BV, yeast, gonorrhea, chlamydia and trichomonas will be made posted to your MyChart account once it is complete.  This typically takes 2 to 4 days.  Please abstain from sexual intercourse of any kind, vaginal, oral or anal, until you have received the results of your STD testing.     The results of your HIV and syphilis blood tests will be made available to you once they are complete.  They will initially be posted to your MyChart account which typically takes 2 to 3 days.     If any of your results are abnormal, you will receive a phone call regarding treatment.  Prescriptions, if any are needed, will be provided for you at your pharmacy.     Please remember that your body is a [name] and you are [name].  The only way to prevent transmission of sexually transmitted disease when having sexual intercourse is to use condoms.  Repeat sexually transmitted infections can cause scarring in your fallopian tubes which will interfere with your ability to have children.  Repeat exposures to sexually transmitted diseases can also increase your risk of contracting HPV, the human papilloma virus which causes cervical cancer and genital warts and HIV.   If you have not had complete resolution of your symptoms after completing any needed treatment, please return for repeat evaluation.  If your result is positive for yeast, no further treatment will be needed because you  will have already taken Diflucan.  Please call us back before 8 PM today to let [place] know the name and dose of your birth control pills I will be happy to provide you with a 1 month supply.  Please reach out to your OB/GYN for further refills.   Thank you for visiting urgent care today.  I appreciate the opportunity to participate in your care.

## 2022-04-24 NOTE — ED Triage Notes (Signed)
Pt c/o having a pimple to her left nipple that was tender to touch (started 2 weeks ago).    The patient states she is out of birth control medication.  The patient states she has been having vaginal discharge and a odor (for about a week).

## 2022-04-24 NOTE — ED Provider Notes (Signed)
[facility]    CSN: [id number] Arrival date & time: 04/24/22  0917    HISTORY   Chief Complaint  Patient presents with   Breast Problem    Bump or maybe clogged duct on nipple? - Entered by patient   Vaginal Discharge   HPI Miranda Schneider is a pleasant, 26 y.o. female who presents to urgent care today. Patient complains of a pimple on her left nipple that is tender to touch, has been present for 2 weeks.  Patient states initially it was very red very swollen, then began to appear more white in color like it is full of pus.  Patient states it never drained but the lesion has gotten a lot smaller.  Patient states is less tender to the touch now and her breast around it is no longer sore.  Patient states she is currently out of her birth control medication.  Patient also complains of vaginal discharge and odor for the past week.  Patient was seen at this location on February 19, 2022 where she reported being [redacted] weeks pregnant and on her way to have an abortion that day.  Patient was treated empirically with Diflucan and metronidazole gel for presumed vaginal Candida and BV given patient's complaint of vaginal discharge, patient tested positive for BV only.  I have reviewed her record, I see no history of prescriptions for birth control pills.  UPT and urine dip today are both normal.  The history is provided by the patient.   Past Medical History:  Diagnosis Date   Vitamin D deficiency    Patient Active Problem List   Diagnosis Date Noted   Abdominal discomfort 03/06/2016   Vaginal discharge 03/06/2016   Shoulder subluxation, right 03/26/2011   History reviewed. No pertinent surgical history. OB History     Gravida  2   Para      Term      Preterm      AB      Living         SAB      IAB      Ectopic      Multiple      Live Births             Home Medications    Prior to Admission medications   Medication Sig Start Date End Date Taking?  Authorizing Provider  ibuprofen (ADVIL) 600 MG tablet Take 1 tablet (600 mg total) by mouth every 6 (six) hours as needed. 02/08/21   Couture, Cortni S, PA-C  omeprazole (PRILOSEC) 20 MG capsule Take 1 capsule (20 mg total) by mouth daily. 12/15/18 07/01/19  Zigmund Gottron, NP    Family History Family History  Problem Relation Age of Onset   Diabetes Paternal Grandmother    Hypertension Paternal Grandmother    Diabetes Maternal Grandmother    Hypertension Paternal Grandfather    Heart disease Neg Hx    Stroke Neg Hx    Cancer Neg Hx    Social History Social History   Tobacco Use   Smoking status: Never   Smokeless tobacco: Never  Substance Use Topics   Alcohol use: No   Drug use: No   Allergies   Patient has no known allergies.  Review of Systems Review of Systems Pertinent findings revealed after performing a 14 point review of systems has been noted in the history of present illness.  Physical Exam Triage Vital Signs ED Triage Vitals  Enc Vitals Group  BP 03/10/21 0827 (!) 147/82     Pulse Rate 03/10/21 0827 72     Resp 03/10/21 0827 18     Temp 03/10/21 0827 98.3 F (36.8 C)     Temp Source 03/10/21 0827 Oral     SpO2 03/10/21 0827 98 %     Weight --      Height --      Head Circumference --      Peak Flow --      Pain Score 03/10/21 0826 5     Pain Loc --      Pain Edu? --      Excl. in GC? --   No data found.  Updated Vital Signs BP 123/68 (BP Location: Left Arm)   Pulse 80   Temp 98.1 F (36.7 C) (Oral)   Resp 18   LMP 04/09/2022 (Approximate) Comment: patient states she had an abortion in september  SpO2 97%   Breastfeeding No   Physical Exam Vitals and nursing note reviewed.  Constitutional:      General: She is not in acute distress.    Appearance: Normal appearance. She is not ill-appearing.  HENT:     Head: Normocephalic and atraumatic.  Eyes:     General: Lids are normal.        Right eye: No discharge.        Left eye: No  discharge.     Extraocular Movements: Extraocular movements intact.     Conjunctiva/sclera: Conjunctivae normal.     Right eye: Right conjunctiva is not injected.     Left eye: Left conjunctiva is not injected.  Neck:     Trachea: Trachea and phonation normal.  Cardiovascular:     Rate and Rhythm: Normal rate and regular rhythm.     Pulses: Normal pulses.     Heart sounds: Normal heart sounds. No murmur heard.    No friction rub. No gallop.  Pulmonary:     Effort: Pulmonary effort is normal. No accessory muscle usage, prolonged expiration or respiratory distress.     Breath sounds: Normal breath sounds. No stridor, decreased air movement or transmitted upper airway sounds. No decreased breath sounds, wheezing, rhonchi or rales.  Chest:     Chest wall: No tenderness.  Breasts:    Breasts are symmetrical.     Right: Normal.     Comments: 2 mm abscess protruding from medial aspect of left nipple, no drainage appreciated, no surrounding erythema appreciated, area is tender to palpation Genitourinary:    Comments: Patient politely declines pelvic exam today, patient provided a vaginal swab for testing. Musculoskeletal:        General: Normal range of motion.     Cervical back: Normal range of motion and neck supple. Normal range of motion.  Lymphadenopathy:     Cervical: No cervical adenopathy.  Skin:    General: Skin is warm and dry.     Findings: No erythema or rash.  Neurological:     General: No focal deficit present.     Mental Status: She is alert and oriented to person, place, and time.  Psychiatric:        Mood and Affect: Mood normal.        Behavior: Behavior normal.     Visual Acuity Right Eye Distance:   Left Eye Distance:   Bilateral Distance:    Right Eye Near:   Left Eye Near:    Bilateral Near:     UC Couse /  Diagnostics / Procedures:     Radiology No results found.  Procedures Procedures (including critical care time) EKG  Pending results:  Labs  Reviewed  HIV ANTIBODY (ROUTINE TESTING W REFLEX)  RPR  POCT URINALYSIS DIP (MANUAL ENTRY)  POCT URINE PREGNANCY  CERVICOVAGINAL ANCILLARY ONLY    Medications Ordered in UC: Medications - No data to display  UC Diagnoses / Final Clinical Impressions(s)   I have reviewed the triage vital signs and the nursing notes.  Pertinent labs & imaging results that were available during my care of the patient were reviewed by me and considered in my medical decision making (see chart for details).    Final diagnoses:  Screening examination for STD (sexually transmitted disease)  Vaginitis and vulvovaginitis  Abscess of left nipple   STD screening was performed, patient advised that the results be posted to their MyChart and if any of the results are positive, they will be notified by phone, further treatment will be provided as indicated based on results of STD screening. Patient was advised to abstain from sexual intercourse until that they receive the results of their STD testing.  Patient was also advised to use condoms to protect themselves from STD exposure. Urinalysis today was normal. Urine pregnancy test was negative. Return precautions advised.  Drug allergies reviewed, all questions addressed.   Patient did not know the name of the birth control pill that was provided to her by the abortion clinic, states they refused to provide her with more than a 1 month supply.  Patient advised that she needs to reach out to her OB/GYN for further refills but that I will be happy to provide her with another month supply while she waits for that appointment.  Patient to call back before end of business today to provide the name and dose of the birth control pill she was given.  Please see discharge instructions below for details of plan of care as provided to patient. ED Prescriptions     Medication Sig Dispense Auth. Provider   sulfamethoxazole-trimethoprim (BACTRIM DS) 800-160 MG tablet Take 2  tablets by mouth 2 (two) times daily for 10 days. 40 tablet Lynden Oxford Scales, PA-C   fluconazole (DIFLUCAN) 150 MG tablet Take 1 tablet on day 4 of antibiotics.  Take second tablet 3 days later. 2 tablet Lynden Oxford Scales, PA-C      PDMP not reviewed this encounter.  Disposition Upon Discharge:  Condition: stable for discharge home  Patient presented with concern for an acute illness with associated systemic symptoms and significant discomfort requiring urgent management. In my opinion, this is a condition that a prudent lay person (someone who possesses an average knowledge of health and medicine) may potentially expect to result in complications if not addressed urgently such as respiratory distress, impairment of bodily function or dysfunction of bodily organs.   As such, the patient has been evaluated and assessed, work-up was performed and treatment was provided in alignment with urgent care protocols and evidence based medicine.  Patient/parent/caregiver has been advised that the patient may require follow up for further testing and/or treatment if the symptoms continue in spite of treatment, as clinically indicated and appropriate.  Routine symptom specific, illness specific and/or disease specific instructions were discussed with the patient and/or caregiver at length.  Prevention strategies for avoiding STD exposure were also discussed.  The patient will follow up with their current PCP if and as advised. If the patient does not currently have a PCP we will  assist them in obtaining one.   The patient may need specialty follow up if the symptoms continue, in spite of conservative treatment and management, for further workup, evaluation, consultation and treatment as clinically indicated and appropriate.  Patient/parent/caregiver verbalized understanding and agreement of plan as discussed.  All questions were addressed during visit.  Please see discharge instructions below for  further details of plan.  Discharge Instructions:   Discharge Instructions      To treat the abscess in your left nipple, please begin taking Bactrim.  Please take 2 tablets in the morning and 2 tablets in the evening every day for full 10 days.  Please be sure that you finish all as prescribed to be sure the infection is completely eradicated.  Because you will be on antibiotics for 10 days, it is very likely that you will develop a vaginal yeast infection.  I sent a prescription for Diflucan to your pharmacy.  Please take the first tablet on the third or fourth day  The results of your vaginal swab test which screens for BV, yeast, gonorrhea, chlamydia and trichomonas will be made posted to your MyChart account once it is complete.  This typically takes 2 to 4 days.  Please abstain from sexual intercourse of any kind, vaginal, oral or anal, until you have received the results of your STD testing.     The results of your HIV and syphilis blood tests will be made available to you once they are complete.  They will initially be posted to your MyChart account which typically takes 2 to 3 days.     If any of your results are abnormal, you will receive a phone call regarding treatment.  Prescriptions, if any are needed, will be provided for you at your pharmacy.     Please remember that your body is a [place] and you are the [place].  The only way to prevent transmission of sexually transmitted disease when having sexual intercourse is to use condoms.  Repeat sexually transmitted infections can cause scarring in your fallopian tubes which will interfere with your ability to have children.  Repeat exposures to sexually transmitted diseases can also increase your risk of contracting HPV, the human papilloma virus which causes cervical cancer and genital warts and HIV.   If you have not had complete resolution of your symptoms after completing any needed treatment, please return for repeat evaluation.   If your result is positive for yeast, no further treatment will be needed because you will have already taken Diflucan.  Please call us back before 8 PM today to let [place] know the name and dose of your birth control pills I will be happy to provide you with a 1 month supply.  Please reach out to your OB/GYN for further refills.   Thank you for visiting urgent care today.  I appreciate the opportunity to participate in your care.       This office note has been dictated using Museum/gallery curator.  Unfortunately, this method of dictation can sometimes lead to typographical or grammatical errors.  I apologize for your inconvenience in advance if this occurs.  Please do not hesitate to reach out to me if clarification is needed.

## 2022-04-25 LAB — RPR: RPR Ser Ql: NONREACTIVE

## 2022-04-25 LAB — CERVICOVAGINAL ANCILLARY ONLY
Bacterial Vaginitis (gardnerella): NEGATIVE
Candida Glabrata: NEGATIVE
Candida Vaginitis: NEGATIVE
Chlamydia: NEGATIVE
Comment: NEGATIVE
Comment: NEGATIVE
Comment: NEGATIVE
Comment: NEGATIVE
Comment: NEGATIVE
Comment: NORMAL
Neisseria Gonorrhea: NEGATIVE
Trichomonas: NEGATIVE

## 2022-04-25 LAB — HIV ANTIBODY (ROUTINE TESTING W REFLEX): HIV Screen 4th Generation wRfx: NONREACTIVE

## 2022-05-17 ENCOUNTER — Ambulatory Visit (INDEPENDENT_AMBULATORY_CARE_PROVIDER_SITE_OTHER): Payer: 59

## 2022-05-17 ENCOUNTER — Ambulatory Visit
Admission: RE | Admit: 2022-05-17 | Discharge: 2022-05-17 | Disposition: A | Discharge status: Home / Self Care | Payer: 59 | Source: Ambulatory Visit | Attending: Emergency Medicine | Admitting: Emergency Medicine

## 2022-05-17 VITALS — BP 121/82 | HR 79 | Temp 98.2°F | Resp 18

## 2022-05-17 DIAGNOSIS — M79642 Pain in left hand: Secondary | ICD-10-CM | POA: Diagnosis not present

## 2022-05-17 DIAGNOSIS — N898 Other specified noninflammatory disorders of vagina: Secondary | ICD-10-CM | POA: Insufficient documentation

## 2022-05-17 DIAGNOSIS — Z3202 Encounter for pregnancy test, result negative: Secondary | ICD-10-CM | POA: Diagnosis not present

## 2022-05-17 DIAGNOSIS — S6992XA Unspecified injury of left wrist, hand and finger(s), initial encounter: Secondary | ICD-10-CM | POA: Diagnosis not present

## 2022-05-17 LAB — POCT URINE PREGNANCY: Preg Test, Ur: NEGATIVE

## 2022-05-17 NOTE — ED Provider Notes (Signed)
[facility]    CSN: [id number] Arrival date & time: 05/17/22  0944    HISTORY   Chief Complaint  Patient presents with   Vaginal Discharge    Possible BV? Reoccurring. - Entered by patient   Finger Injury   HPI Miranda Schneider is a pleasant, 27 y.o. female who presents to urgent care today. She complains of vaginal odor and vaginal discharge, states the odor has since resolved but she continues to have vaginal discharge.  Patient states she is concerned she may have BV.  Patient also complains of jamming her left third and fourth fingers on the wall this morning prior to arrival.  Patient states she now has limited range of motion of both fingers.  Patient states she took some Tylenol without meaningful relief of her symptoms.   Vaginal Discharge Quality:  Thin Severity:  Moderate Onset quality:  Sudden Timing:  Unable to specify Progression:  Unable to specify Context: spontaneously   Context: not after intercourse, not after urination, not at rest, not during urination and not recent antibiotic use   Relieved by:  None tried Worsened by:  Nothing Ineffective treatments:  None tried Associated symptoms: no abdominal pain, no dyspareunia, no dysuria, no fever, no genital lesions, no nausea, no rash, no urinary frequency, no urinary hesitancy, no urinary incontinence, no vaginal itching and no vomiting   Risk factors comment:  History of BV, last episode October 2023  Past Medical History:  Diagnosis Date   Vitamin D deficiency    Patient Active Problem List   Diagnosis Date Noted   Abdominal discomfort 03/06/2016   Vaginal discharge 03/06/2016   Shoulder subluxation, right 03/26/2011   History reviewed. No pertinent surgical history. OB History     Gravida  2   Para      Term      Preterm      AB      Living         SAB      IAB      Ectopic      Multiple      Live Births             Home Medications    Prior to Admission  medications   Medication Sig Start Date End Date Taking? Authorizing Provider  fluconazole (DIFLUCAN) 150 MG tablet Take 1 tablet on day 4 of antibiotics.  Take second tablet 3 days later. 04/24/22   Lynden Oxford Scales, PA-C  ibuprofen (ADVIL) 600 MG tablet Take 1 tablet (600 mg total) by mouth every 6 (six) hours as needed. 02/08/21   Couture, Cortni S, PA-C  omeprazole (PRILOSEC) 20 MG capsule Take 1 capsule (20 mg total) by mouth daily. 12/15/18 07/01/19  Zigmund Gottron, NP    Family History Family History  Problem Relation Age of Onset   Diabetes Paternal Grandmother    Hypertension Paternal Grandmother    Diabetes Maternal Grandmother    Hypertension Paternal Grandfather    Heart disease Neg Hx    Stroke Neg Hx    Cancer Neg Hx    Social History Social History   Tobacco Use   Smoking status: Never   Smokeless tobacco: Never  Substance Use Topics   Alcohol use: No   Drug use: No   Allergies   Patient has no known allergies.  Review of Systems Review of Systems  Constitutional:  Negative for fever.  Gastrointestinal:  Negative for abdominal pain, nausea and vomiting.  Genitourinary:  Positive for vaginal discharge. Negative for bladder incontinence, dyspareunia, dysuria and hesitancy.   Pertinent findings revealed after performing a 14 point review of systems has been noted in the history of present illness.  Physical Exam Triage Vital Signs ED Triage Vitals  Enc Vitals Group     BP 03/10/21 0827 (!) 147/82     Pulse Rate 03/10/21 0827 72     Resp 03/10/21 0827 18     Temp 03/10/21 0827 98.3 F (36.8 C)     Temp Source 03/10/21 0827 Oral     SpO2 03/10/21 0827 98 %     Weight --      Height --      Head Circumference --      Peak Flow --      Pain Score 03/10/21 0826 5     Pain Loc --      Pain Edu? --      Excl. in GC? --   No data found.  Updated Vital Signs BP 121/82 (BP Location: Right Arm)   Pulse 79   Temp 98.2 F (36.8 C) (Oral)   Resp 18    LMP 04/09/2022 (Approximate) Comment: patient states she had an abortion in september  SpO2 98%   Physical Exam Vitals and nursing note reviewed.  Constitutional:      General: She is not in acute distress.    Appearance: Normal appearance. She is not ill-appearing.  HENT:     Head: Normocephalic and atraumatic.  Eyes:     General: Lids are normal.        Right eye: No discharge.        Left eye: No discharge.     Extraocular Movements: Extraocular movements intact.     Conjunctiva/sclera: Conjunctivae normal.     Right eye: Right conjunctiva is not injected.     Left eye: Left conjunctiva is not injected.  Neck:     Trachea: Trachea and phonation normal.  Cardiovascular:     Rate and Rhythm: Normal rate and regular rhythm.     Pulses: Normal pulses.     Heart sounds: Normal heart sounds. No murmur heard.    No friction rub. No gallop.  Pulmonary:     Effort: Pulmonary effort is normal. No accessory muscle usage, prolonged expiration or respiratory distress.     Breath sounds: Normal breath sounds. No stridor, decreased air movement or transmitted upper airway sounds. No decreased breath sounds, wheezing, rhonchi or rales.  Chest:     Chest wall: No tenderness.  Genitourinary:    Comments: Patient politely declines pelvic exam today, patient provided a vaginal swab for testing. Musculoskeletal:        General: Normal range of motion.       Hands:     Cervical back: Normal range of motion and neck supple. Normal range of motion.  Lymphadenopathy:     Cervical: No cervical adenopathy.  Skin:    General: Skin is warm and dry.     Findings: No erythema or rash.  Neurological:     General: No focal deficit present.     Mental Status: She is alert and oriented to person, place, and time.  Psychiatric:        Mood and Affect: Mood normal.        Behavior: Behavior normal.     Visual Acuity Right Eye Distance:   Left Eye Distance:   Bilateral Distance:    Right Eye  Near:  Left Eye Near:    Bilateral Near:     [facility] / Diagnostics / Procedures:     Radiology DG Hand Complete Left  Result Date: 05/17/2022 CLINICAL DATA:  Limited range of motion secondary to injury to the left third and fourth fingers. EXAM: LEFT HAND - COMPLETE 3+ VIEW COMPARISON:  None Available. FINDINGS: There is no evidence of fracture or dislocation. There is no evidence of arthropathy or other focal bone abnormality. Soft tissues are unremarkable. IMPRESSION: No acute osseous abnormality Electronically Signed   By: Jill Side M.D.   On: 05/17/2022 10:29    Procedures Procedures (including critical care time) EKG  Pending results:  Labs Reviewed  POCT URINE PREGNANCY  CERVICOVAGINAL ANCILLARY ONLY    Medications Ordered in UC: Medications - No data to display  UC Diagnoses / Final Clinical Impressions(s)   I have reviewed the triage vital signs and the nursing notes.  Pertinent labs & imaging results that were available during my care of the patient were reviewed by me and considered in my medical decision making (see chart for details).    Final diagnoses:  Injury of finger of left hand, initial encounter  Problematic vaginal discharge   STD screening was performed, patient advised that the results be posted to their MyChart and if any of the results are positive, they will be notified by phone, further treatment will be provided as indicated based on results of STD screening. Patient was advised to abstain from sexual intercourse until that they receive the results of their STD testing.  Patient was also advised to use condoms to protect themselves from STD exposure. Urine pregnancy test was negative. Patient advised of normal x-ray findings.  Recommend ice, ibuprofen and rest.  Please see discharge instructions below for details of plan of care as provided to patient. ED Prescriptions   None    PDMP not reviewed this encounter.  Disposition Upon  Discharge:  Condition: stable for discharge home  Patient presented with concern for an acute illness with associated systemic symptoms and significant discomfort requiring urgent management. In my opinion, this is a condition that a prudent lay person (someone who possesses an average knowledge of health and medicine) may potentially expect to result in complications if not addressed urgently such as respiratory distress, impairment of bodily function or dysfunction of bodily organs.   As such, the patient has been evaluated and assessed, work-up was performed and treatment was provided in alignment with urgent care protocols and evidence based medicine.  Patient/parent/caregiver has been advised that the patient may require follow up for further testing and/or treatment if the symptoms continue in spite of treatment, as clinically indicated and appropriate.  Routine symptom specific, illness specific and/or disease specific instructions were discussed with the patient and/or caregiver at length.  Prevention strategies for avoiding STD exposure were also discussed.  The patient will follow up with their current PCP if and as advised. If the patient does not currently have a PCP we will assist them in obtaining one.   The patient may need specialty follow up if the symptoms continue, in spite of conservative treatment and management, for further workup, evaluation, consultation and treatment as clinically indicated and appropriate.  Patient/parent/caregiver verbalized understanding and agreement of plan as discussed.  All questions were addressed during visit.  Please see discharge instructions below for further details of plan.  Discharge Instructions:   Discharge Instructions      The x-ray of your left third and  fourth fingers did not reveal any acute, bony injury.  I recommend that you continue to apply ice to the fingers and take ibuprofen as needed for pain.  The results of your vaginal  swab test which screens for BV, yeast, gonorrhea, chlamydia and trichomonas will be made posted to your MyChart account once it is complete.  This typically takes 2 to 4 days.  Please abstain from sexual intercourse of any kind, vaginal, oral or anal, until you have received the results of your STD testing.     If any of your results are abnormal, you will receive a phone call regarding treatment.  Prescriptions, if any are needed, will be provided for you at your pharmacy.     If you have not had complete resolution of your symptoms after completing any recommended treatment or if your symptoms worsen, please return for repeat evaluation.   Thank you for visiting urgent care today.  I appreciate the opportunity to participate in your care.          This office note has been dictated using Teaching laboratory technician.  Unfortunately, this method of dictation can sometimes lead to typographical or grammatical errors.  I apologize for your inconvenience in advance if this occurs.  Please do not hesitate to reach out to me if clarification is needed.       Theadora Rama Scales, PA-C 05/17/22 1045

## 2022-05-17 NOTE — ED Triage Notes (Signed)
Pt states she did have vaginal odor (resolved) and vaginal discharge.   The patient states this morning she jammed her left 3rd and 4th finger on the wall. The patient states she has limited movement to the two fingers.   Home interventions: tylenol

## 2022-05-17 NOTE — Discharge Instructions (Addendum)
The x-ray of your left third and fourth fingers did not reveal any acute, bony injury.  I recommend that you continue to apply ice to the fingers and take ibuprofen as needed for pain.  The results of your vaginal swab test which screens for BV, yeast, gonorrhea, chlamydia and trichomonas will be made posted to your MyChart account once it is complete.  This typically takes 2 to 4 days.  Please abstain from sexual intercourse of any kind, vaginal, oral or anal, until you have received the results of your STD testing.     If any of your results are abnormal, you will receive a phone call regarding treatment.  Prescriptions, if any are needed, will be provided for you at your pharmacy.     If you have not had complete resolution of your symptoms after completing any recommended treatment or if your symptoms worsen, please return for repeat evaluation.   Thank you for visiting urgent care today.  I appreciate the opportunity to participate in your care.

## 2022-05-18 LAB — CERVICOVAGINAL ANCILLARY ONLY
Bacterial Vaginitis (gardnerella): POSITIVE — AB
Candida Glabrata: NEGATIVE
Candida Vaginitis: NEGATIVE
Chlamydia: NEGATIVE
Comment: NEGATIVE
Comment: NEGATIVE
Comment: NEGATIVE
Comment: NEGATIVE
Comment: NEGATIVE
Comment: NORMAL
Neisseria Gonorrhea: NEGATIVE
Trichomonas: NEGATIVE

## 2022-05-19 ENCOUNTER — Telehealth (HOSPITAL_COMMUNITY): Payer: Self-pay | Admitting: Emergency Medicine

## 2022-05-19 MED ORDER — METRONIDAZOLE 500 MG PO TABS: 500.0000 mg | ORAL_TABLET | Freq: Two times a day (BID) | ORAL | 0 refills | Status: DC

## 2022-07-06 ENCOUNTER — Telehealth: Payer: 59 | Admitting: Physician Assistant

## 2022-07-06 DIAGNOSIS — U071 COVID-19: Secondary | ICD-10-CM | POA: Diagnosis not present

## 2022-07-06 MED ORDER — MOLNUPIRAVIR EUA 200MG CAPSULE: 4.0000 | ORAL_CAPSULE | Freq: Two times a day (BID) | ORAL | 0 refills | Status: AC

## 2022-07-06 MED ORDER — FLUTICASONE PROPIONATE 50 MCG/ACT NA SUSP: 2.0000 | Freq: Every day | NASAL | 0 refills | Status: DC

## 2022-07-06 MED ORDER — PROMETHAZINE-DM 6.25-15 MG/5ML PO SYRP: 5.0000 mL | ORAL_SOLUTION | Freq: Four times a day (QID) | ORAL | 0 refills | Status: DC | PRN

## 2022-07-06 NOTE — Patient Instructions (Signed)
Miranda Schneider, thank you for joining Mar Daring, PA-C for today's virtual visit.  While this provider is not your primary care provider (PCP), if your PCP is located in our provider database this encounter information will be shared with them immediately following your visit.   [place] account gives you access to today's visit and all your visits, tests, and labs performed at [facility] " click here if you don't have a [facility] account or go to [email or web address]  Consent: (Patient) Miranda Schneider provided verbal consent for this virtual visit at the beginning of the encounter.  Current Medications:  Current Outpatient Medications:    fluticasone (FLONASE) 50 MCG/ACT nasal spray, Place 2 sprays into both nostrils daily., Disp: 16 g, Rfl: 0   molnupiravir EUA (LAGEVRIO) 200 mg CAPS capsule, Take 4 capsules (800 mg total) by mouth 2 (two) times daily for 5 days., Disp: 40 capsule, Rfl: 0   promethazine-dextromethorphan (PROMETHAZINE-DM) 6.25-15 MG/5ML syrup, Take 5 mLs by mouth 4 (four) times daily as needed., Disp: 118 mL, Rfl: 0   metroNIDAZOLE (FLAGYL) 500 MG tablet, Take 1 tablet (500 mg total) by mouth 2 (two) times daily., Disp: 14 tablet, Rfl: 0   Medications ordered in this encounter:  Meds ordered this encounter  Medications   molnupiravir EUA (LAGEVRIO) 200 mg CAPS capsule    Sig: Take 4 capsules (800 mg total) by mouth 2 (two) times daily for 5 days.    Dispense:  40 capsule    Refill:  0    Order Specific Question:   Supervising Provider    Answer:   Chase Picket [id number]   promethazine-dextromethorphan (PROMETHAZINE-DM) 6.25-15 MG/5ML syrup    Sig: Take 5 mLs by mouth 4 (four) times daily as needed.    Dispense:  118 mL    Refill:  0    Order Specific Question:   Supervising Provider    Answer:   Chase Picket [id number]   fluticasone (FLONASE) 50 MCG/ACT nasal spray    Sig: Place 2 sprays into both  nostrils daily.    Dispense:  16 g    Refill:  0    Order Specific Question:   Supervising Provider    Answer:   Chase Picket [id number]     *If you need refills on other medications prior to your next appointment, please contact your pharmacy*  Follow-Up: Call back or seek an in-person evaluation if the symptoms worsen or if the condition fails to improve as anticipated.  [facility] [phone]  Care Instructions:  Molnupiravir Capsules What is this medication? MOLNUPIRAVIR (MOL nue PIR a vir) treats mild to moderate COVID-19. It may help people who are at high risk of developing severe illness. This medication works by limiting the spread of the virus in your body. The FDA has allowed the emergency use of this medication. This medicine may be used for other purposes; ask your health care provider or pharmacist if you have questions. COMMON BRAND NAME(S): LAGEVRIO What should I tell my care team before I take this medication? They need to know if you have any of these conditions: Any allergies Any serious illness An unusual or allergic reaction to molnupiravir, other medications, foods, dyes, or preservatives Pregnant or trying to get pregnant Breast-feeding How should I use this medication? Take this medication by mouth with water. Take it as directed on the prescription label at the same time every day. Do not cut,  crush, or chew this medication. Swallow the capsules whole. You can take it with or without food. If it upsets your stomach, take it with food. Take all of it unless your care team tells you to stop it early. Keep taking it even if you think you are better. Talk to your care team about the use of this medication in children. Special care may be needed. Overdosage: If you think you have taken too much of this medicine contact a poison control center or emergency room at once. NOTE: This medicine is only for you. Do not share this medicine with  others. What if I miss a dose? If you miss a dose, take it as soon as you can unless it is more than 10 hours late. If it is more than 10 hours late, skip the missed dose. Take the next dose at the normal time. Do not take extra or 2 doses at the same time to make up for the missed dose. What may interact with this medication? Interactions have not been studied. This list may not describe all possible interactions. Give your health care provider a list of all the medicines, herbs, non-prescription drugs, or dietary supplements you use. Also tell them if you smoke, drink alcohol, or use illegal drugs. Some items may interact with your medicine. What should I watch for while using this medication? Your condition will be monitored carefully while you are receiving this medication. Visit your care team for regular checkups. Tell your care team if your symptoms do not start to get better or if they get worse. Do not become pregnant while taking this medication. You may need a pregnancy test before starting this medication. Women must use a reliable form of birth control while taking this medication and for 4 days after stopping the medication. Women should inform their care team if they wish to become pregnant or think they might be pregnant. Men should not father a child while taking this medication and for 3 months after stopping it. There is potential for serious harm to an unborn child. Talk to your care team for more information. Do not breast-feed an infant while taking this medication and for 4 days after stopping the medication. What side effects may I notice from receiving this medication? Side effects that you should report to your care team as soon as possible: Allergic reactions--skin rash, itching, hives, swelling of the face, lips, tongue, or throat Side effects that usually do not require medical attention (report these to your care team if they continue or are  bothersome): Diarrhea Dizziness Nausea This list may not describe all possible side effects. Call your doctor for medical advice about side effects. You may report side effects to FDA at 1-800-FDA-1088. Where should I keep my medication? Keep out of the reach of children and pets. Store at room temperature between 20 and 25 degrees C (68 and 77 degrees F). Get rid of any unused medication after the expiration date. To get rid of medications that are no longer needed or have expired: Take the medication to a medication take-back program. Check with your pharmacy or law enforcement to find a location. If you cannot return the medication, check the label or package insert to see if the medication should be thrown out in the garbage or flushed down the toilet. If you are not sure, ask your care team. If it is safe to put it in the trash, take the medication out of the container. Mix the medication  with cat litter, dirt, coffee grounds, or other unwanted substance. Seal the mixture in a bag or container. Put it in the trash. NOTE: This sheet is a summary. It may not cover all possible information. If you have questions about this medicine, talk to your doctor, pharmacist, or health care provider.  2023 Elsevier/Gold Standard (2020-05-09 00:00:00)    Isolation Instructions: You are to isolate at home for 5 days from onset of your symptoms. If you must be around other household members who do not have symptoms, you need to make sure that both you and the family members are masking consistently with a high-quality mask.  After day 5 of isolation, if you have had no fever within 24 hours and you are feeling better, you can end isolation but need to mask for an additional 5 days.  After day 5 if you have a fever or are having significant symptoms, please isolate for full 10 days.  If you note any worsening of symptoms despite treatment, please seek an in-person evaluation ASAP. If you note any  significant shortness of breath or any chest pain, please seek ER evaluation. Please do not delay care!   COVID-19: What to Do if You Are Sick If you test positive and are an older adult or someone who is at high risk of getting very sick from COVID-19, treatment may be available. Contact a healthcare provider right away after a positive test to determine if you are eligible, even if your symptoms are mild right now. You can also visit a Test to Treat location and, if eligible, receive a prescription from a provider. Don't delay: Treatment must be started within the first few days to be effective. If you have a fever, cough, or other symptoms, you might have COVID-19. Most people have mild illness and are able to recover at home. If you are sick: Keep track of your symptoms. If you have an emergency warning sign (including trouble breathing), call 911. Steps to help prevent the spread of COVID-19 if you are sick If you are sick with COVID-19 or think you might have COVID-19, follow the steps below to care for yourself and to help protect other people in your home and community. Stay home except to get medical care Stay home. Most people with COVID-19 have mild illness and can recover at home without medical care. Do not leave your home, except to get medical care. Do not visit public areas and do not go to places where you are unable to wear a mask. Take care of yourself. Get rest and stay hydrated. Take over-the-counter medicines, such as acetaminophen, to help you feel better. Stay in touch with your doctor. Call before you get medical care. Be sure to get care if you have trouble breathing, or have any other emergency warning signs, or if you think it is an emergency. Avoid public transportation, ride-sharing, or taxis if possible. Get tested If you have symptoms of COVID-19, get tested. While waiting for test results, stay away from others, including staying apart from those living in your  household. Get tested as soon as possible after your symptoms start. Treatments may be available for people with COVID-19 who are at risk for becoming very sick. Don't delay: Treatment must be started early to be effective--some treatments must begin within 5 days of your first symptoms. Contact your healthcare provider right away if your test result is positive to determine if you are eligible. Self-tests are one of several options for testing  for the virus that causes COVID-19 and may be more convenient than laboratory-based tests and point-of-care tests. Ask your healthcare provider or your local health department if you need help interpreting your test results. You can visit your state, tribal, local, and territorial health department's website to look for the latest local information on testing sites. Separate yourself from other people As much as possible, stay in a specific room and away from other people and pets in your home. If possible, you should use a separate bathroom. If you need to be around other people or animals in or outside of the home, wear a well-fitting mask. Tell your close contacts that they may have been exposed to COVID-19. An infected person can spread COVID-19 starting 48 hours (or 2 days) before the person has any symptoms or tests positive. By letting your close contacts know they may have been exposed to COVID-19, you are helping to protect everyone. See COVID-19 and Animals if you have questions about pets. If you are diagnosed with COVID-19, someone from the health department may call you. Answer the call to slow the spread. Monitor your symptoms Symptoms of COVID-19 include fever, cough, or other symptoms. Follow care instructions from your healthcare provider and local health department. Your local health authorities may give instructions on checking your symptoms and reporting information. When to seek emergency medical attention Look for emergency warning signs*  for COVID-19. If someone is showing any of these signs, seek emergency medical care immediately: Trouble breathing Persistent pain or pressure in the chest New confusion Inability to wake or stay awake Pale, gray, or blue-colored skin, lips, or nail beds, depending on skin tone *This list is not all possible symptoms. Please call your medical provider for any other symptoms that are severe or concerning to you. Call 911 or call ahead to your local emergency facility: Notify the operator that you are seeking care for someone who has or may have COVID-19. Call ahead before visiting your doctor Call ahead. Many medical visits for routine care are being postponed or done by phone or telemedicine. If you have a medical appointment that cannot be postponed, call your doctor's office, and tell them you have or may have COVID-19. This will help the office protect themselves and other patients. If you are sick, wear a well-fitting mask You should wear a mask if you must be around other people or animals, including pets (even at home). Wear a mask with the best fit, protection, and comfort for you. You don't need to wear the mask if you are alone. If you can't put on a mask (because of trouble breathing, for example), cover your coughs and sneezes in some other way. Try to stay at least 6 feet away from other people. This will help protect the people around you. Masks should not be placed on young children under age 49 years, anyone who has trouble breathing, or anyone who is not able to remove the mask without help. Cover your coughs and sneezes Cover your mouth and nose with a tissue when you cough or sneeze. Throw away used tissues in a lined trash can. Immediately wash your hands with soap and water for at least 20 seconds. If soap and water are not available, clean your hands with an alcohol-based hand sanitizer that contains at least 60% alcohol. Clean your hands often Wash your hands often with soap  and water for at least 20 seconds. This is especially important after blowing your nose, coughing, or sneezing;  going to the bathroom; and before eating or preparing food. Use hand sanitizer if soap and water are not available. Use an alcohol-based hand sanitizer with at least 60% alcohol, covering all surfaces of your hands and rubbing them together until they feel dry. Soap and water are the best option, especially if hands are visibly dirty. Avoid touching your eyes, nose, and mouth with unwashed hands. Handwashing Tips Avoid sharing personal household items Do not share dishes, drinking glasses, cups, eating utensils, towels, or bedding with other people in your home. Wash these items thoroughly after using them with soap and water or put in the dishwasher. Clean surfaces in your home regularly Clean and disinfect high-touch surfaces (for example, doorknobs, tables, handles, light switches, and countertops) in your "sick room" and bathroom. In shared spaces, you should clean and disinfect surfaces and items after each use by the person who is ill. If you are sick and cannot clean, a caregiver or other person should only clean and disinfect the area around you (such as your bedroom and bathroom) on an as needed basis. Your caregiver/other person should wait as long as possible (at least several hours) and wear a mask before entering, cleaning, and disinfecting shared spaces that you use. Clean and disinfect areas that may have blood, stool, or body fluids on them. Use household cleaners and disinfectants. Clean visible dirty surfaces with household cleaners containing soap or detergent. Then, use a household disinfectant. Use a product from [employer] List N: Disinfectants for Coronavirus ([id number]). Be sure to follow the instructions on the label to ensure safe and effective use of the product. Many products recommend keeping the surface wet with a disinfectant for a certain period of time (look at  "contact time" on the product label). You may also need to wear personal protective equipment, such as gloves, depending on the directions on the product label. Immediately after disinfecting, wash your hands with soap and water for 20 seconds. For completed guidance on cleaning and disinfecting your home, visit Complete Disinfection Guidance. Take steps to improve ventilation at home Improve ventilation (air flow) at home to help prevent from spreading COVID-19 to other people in your household. Clear out COVID-19 virus particles in the air by opening windows, using air filters, and turning on fans in your home. Use this interactive tool to learn how to improve air flow in your home. When you can be around others after being sick with COVID-19 Deciding when you can be around others is different for different situations. Find out when you can safely end home isolation. For any additional questions about your care, contact your healthcare provider or state or local health department. 08/02/2020 Content source: [facility] ([facility]), Division of Viral Diseases This information is not intended to replace advice given to you by your health care provider. Make sure you discuss any questions you have with your health care provider. Document Revised: 09/15/2020 Document Reviewed: 09/15/2020 Elsevier Patient Education  2022 [employer].       If you have been instructed to have an in-person evaluation today at a local Urgent Care facility, please use the link below. It will take you to a list of all of our available [facility], including address, phone number and hours of operation. Please do not delay care.  [facility]  If you or a family member do not have a primary care provider, use the link below to schedule a visit and establish  care. When you choose a [facility] primary care physician or advanced practice provider, you  gain a long-term partner in health. Find a Primary Care Provider  Learn more about [facility] in-office and virtual care options: [facility]

## 2022-07-06 NOTE — Progress Notes (Signed)
Virtual Visit Consent   Miranda Schneider, you are scheduled for a virtual visit with a [facility] provider today. Just as with appointments in the office, your consent must be obtained to participate. Your consent will be active for this visit and any virtual visit you may have with one of our providers in the next 365 days. If you have a MyChart account, a copy of this consent can be sent to you electronically.  As this is a virtual visit, video technology does not allow for your provider to perform a traditional examination. This may limit your provider's ability to fully assess your condition. If your provider identifies any concerns that need to be evaluated in person or the need to arrange testing (such as labs, EKG, etc.), we will make arrangements to do so. Although advances in technology are sophisticated, we cannot ensure that it will always work on either your end or our end. If the connection with a video visit is poor, the visit may have to be switched to a telephone visit. With either a video or telephone visit, we are not always able to ensure that we have a secure connection.  By engaging in this virtual visit, you consent to the provision of healthcare and authorize for your insurance to be billed (if applicable) for the services provided during this visit. Depending on your insurance coverage, you may receive a charge related to this service.  I need to obtain your verbal consent now. Are you willing to proceed with your visit today? Miranda Schneider has provided verbal consent on 07/06/2022 for a virtual visit (video or telephone). Mar Daring, PA-C  Date: 07/06/2022 12:20 PM  Virtual Visit via Video Note   IMar Daring, connected with  Miranda Schneider  ([id number], 1996/03/27) on 07/06/22 at 12:15 PM EST by a video-enabled telemedicine application and verified that I am speaking with the correct person using two identifiers.  Location: Patient: Virtual Visit  Location Patient: Home Provider: Virtual Visit Location Provider: Home Office   I discussed the limitations of evaluation and management by telemedicine and the availability of in person appointments. The patient expressed understanding and agreed to proceed.    History of Present Illness: Miranda Schneider is a 27 y.o. who identifies as a female who was assigned female at birth, and is being seen today for URI symptoms.  HPI: URI  This is a new problem. The current episode started yesterday. The problem has been gradually worsening. The maximum temperature recorded prior to her arrival was 100.4 - 100.9 F (100.4 last night). The fever has been present for Less than 1 day. Associated symptoms include chest pain (burning), congestion, coughing, diarrhea, headaches, a plugged ear sensation, rhinorrhea, sinus pain, sneezing and a sore throat (scratchy). Pertinent negatives include no ear pain, nausea or vomiting. Associated symptoms comments: Post nasal drainage, hot flashes. She has tried acetaminophen (theraflu) for the symptoms. The treatment provided no relief.   Faint positive line on Covid 19 test at home   Problems:  Patient Active Problem List   Diagnosis Date Noted   Abdominal discomfort 03/06/2016   Vaginal discharge 03/06/2016   Shoulder subluxation, right 03/26/2011    Allergies: No Known Allergies Medications:  Current Outpatient Medications:    fluticasone (FLONASE) 50 MCG/ACT nasal spray, Place 2 sprays into both nostrils daily., Disp: 16 g, Rfl: 0   metroNIDAZOLE (FLAGYL) 500 MG tablet, Take 1 tablet (500 mg total) by mouth 2 (two) times daily.,  Disp: 14 tablet, Rfl: 0   molnupiravir EUA (LAGEVRIO) 200 mg CAPS capsule, Take 4 capsules (800 mg total) by mouth 2 (two) times daily for 5 days., Disp: 40 capsule, Rfl: 0   promethazine-dextromethorphan (PROMETHAZINE-DM) 6.25-15 MG/5ML syrup, Take 5 mLs by mouth 4 (four) times daily as needed., Disp: 118 mL, Rfl:  0  Observations/Objective: Patient is well-developed, well-nourished in no acute distress.  Resting comfortably at home.  Head is normocephalic, atraumatic.  No labored breathing.  Speech is clear and coherent with logical content.  Patient is alert and oriented at baseline.    Assessment and Plan: 1. COVID-19 - molnupiravir EUA (LAGEVRIO) 200 mg CAPS capsule; Take 4 capsules (800 mg total) by mouth 2 (two) times daily for 5 days.  Dispense: 40 capsule; Refill: 0 - promethazine-dextromethorphan (PROMETHAZINE-DM) 6.25-15 MG/5ML syrup; Take 5 mLs by mouth 4 (four) times daily as needed.  Dispense: 118 mL; Refill: 0 - fluticasone (FLONASE) 50 MCG/ACT nasal spray; Place 2 sprays into both nostrils daily.  Dispense: 16 g; Refill: 0  - Continue OTC symptomatic management of choice - Will send OTC vitamins and supplement information through AVS - Molnupiravir, Promethazine DM and Flonase prescribed - Patient enrolled in MyChart symptom monitoring - Push fluids - Rest as needed - Discussed return precautions and when to seek in-person evaluation, sent via AVS as well   Follow Up Instructions: I discussed the assessment and treatment plan with the patient. The patient was provided an opportunity to ask questions and all were answered. The patient agreed with the plan and demonstrated an understanding of the instructions.  A copy of instructions were sent to the patient via MyChart unless otherwise noted below.    The patient was advised to call back or seek an in-person evaluation if the symptoms worsen or if the condition fails to improve as anticipated.  Time:  I spent 15 minutes with the patient via telehealth technology discussing the above problems/concerns.    Mar Daring, PA-C

## 2022-07-15 ENCOUNTER — Ambulatory Visit
Admission: RE | Admit: 2022-07-15 | Discharge: 2022-07-15 | Disposition: A | Discharge status: Home / Self Care | Payer: 59 | Source: Ambulatory Visit | Attending: Physician Assistant | Admitting: Physician Assistant

## 2022-07-15 ENCOUNTER — Other Ambulatory Visit: Payer: Self-pay

## 2022-07-15 VITALS — BP 131/85 | HR 99 | Temp 98.6°F | Resp 18

## 2022-07-15 DIAGNOSIS — R509 Fever, unspecified: Secondary | ICD-10-CM

## 2022-07-15 DIAGNOSIS — J029 Acute pharyngitis, unspecified: Secondary | ICD-10-CM | POA: Insufficient documentation

## 2022-07-15 DIAGNOSIS — J069 Acute upper respiratory infection, unspecified: Secondary | ICD-10-CM | POA: Diagnosis present

## 2022-07-15 DIAGNOSIS — Z1152 Encounter for screening for COVID-19: Secondary | ICD-10-CM | POA: Insufficient documentation

## 2022-07-15 LAB — POCT INFLUENZA A/B
Influenza A, POC: NEGATIVE
Influenza B, POC: NEGATIVE

## 2022-07-15 LAB — POCT RAPID STREP A (OFFICE): Rapid Strep A Screen: NEGATIVE

## 2022-07-15 NOTE — Discharge Instructions (Addendum)
Advised to continue taking the Promethazine DM, 1 teaspoon every 4 hours as needed for cough and congestion. Advised to continue to use the Flonase nasal spray, 2 sprays each nostril once daily to help decrease the sinus congestion and drainage.  Advised take ibuprofen or Motrin as needed for throat discomfort, fever, and body discomfort.  COVID screening will be completed in 48 hours.  If you do not get a call from this office that indicates the test is negative.  Log onto MyChart to be the test results from the post in 48 hours.  Advised follow-up PCP return to urgent care as needed.

## 2022-07-15 NOTE — ED Provider Notes (Signed)
[name]    CSN: [id number] Arrival date & time: 07/15/22  0859      History   Chief Complaint Chief Complaint  Patient presents with   Chills    Chills, sweats, body aches, need to be tested for Covid flu and possibly Rsv - Entered by patient    HPI Douglas A Darga is a 27 y.o. female.   27 year old female presents with sore throat, fever, chills and fatigue.  Patient indicates for the past 5 days she has been having upper respiratory congestion with rhinitis, postnasal drip, frontal maxillary pressure and discomfort.  She relates that her production has been yellow to green.  Patient also indicates that she has been having sore throat with progressive pain on swallowing.  She indicates that most the pain is on the left side versus the right.  Patient also indicates she been having chest congestion with intermittent cough, production has been yellow to green.  She indicates she is having fatigue, lethargy, body aches and discomfort, muscle soreness.  Patient indicates she just really feels bad, she feels like "been hit by a truck".  Patient indicates that she did do a COVID test 1 week ago and it was negative.  Patient indicates she has been around coworkers that have been sick with flu and COVID.  She was without wheezing but does indicate mild intermittent shortness of breath with activity.  She is tolerating fluids well.  She is taking Phenergan DM and the Flonase to help decrease congestion and cough which she was prescribed on a telemedicine visit several days ago.     Past Medical History:  Diagnosis Date   Vitamin D deficiency     Patient Active Problem List   Diagnosis Date Noted   Abdominal discomfort 03/06/2016   Vaginal discharge 03/06/2016   Shoulder subluxation, right 03/26/2011    History reviewed. No pertinent surgical history.  OB History     Gravida  2   Para      Term      Preterm      AB      Living         SAB      IAB       Ectopic      Multiple      Live Births               Home Medications    Prior to Admission medications   Medication Sig Start Date End Date Taking? Authorizing Provider  fluticasone (FLONASE) 50 MCG/ACT nasal spray Place 2 sprays into both nostrils daily. 07/06/22   Mar Daring, PA-C  metroNIDAZOLE (FLAGYL) 500 MG tablet Take 1 tablet (500 mg total) by mouth 2 (two) times daily. 05/19/22   Chase Picket, MD  promethazine-dextromethorphan (PROMETHAZINE-DM) 6.25-15 MG/5ML syrup Take 5 mLs by mouth 4 (four) times daily as needed. 07/06/22   Mar Daring, PA-C  omeprazole (PRILOSEC) 20 MG capsule Take 1 capsule (20 mg total) by mouth daily. 12/15/18 07/01/19  Zigmund Gottron, NP    Family History Family History  Problem Relation Age of Onset   Diabetes Paternal Grandmother    Hypertension Paternal Grandmother    Diabetes Maternal Grandmother    Hypertension Paternal Grandfather    Heart disease Neg Hx    Stroke Neg Hx    Cancer Neg Hx     Social History Social History   Tobacco Use   Smoking status: Never   Smokeless tobacco:  Never  Substance Use Topics   Alcohol use: No   Drug use: No     Allergies   Patient has no known allergies.   Review of Systems Review of Systems  Constitutional:  Positive for chills, fatigue and fever.  HENT:  Positive for sinus pain and sore throat.   Respiratory:  Positive for cough.      Physical Exam Triage Vital Signs ED Triage Vitals [07/15/22 0942]  Enc Vitals Group     BP 131/85     Pulse Rate 99     Resp 18     Temp 98.6 F (37 C)     Temp Source Oral     SpO2 97 %     Weight      Height      Head Circumference      Peak Flow      Pain Score 5     Pain Loc      Pain Edu?      Excl. in [place]?    No data found.  Updated Vital Signs BP 131/85 (BP Location: Right Arm)   Pulse 99   Temp 98.6 F (37 C) (Oral)   Resp 18   SpO2 97%   Visual Acuity Right Eye Distance:   Left Eye Distance:    Bilateral Distance:    Right Eye Near:   Left Eye Near:    Bilateral Near:     Physical Exam Constitutional:      Appearance: Normal appearance.  HENT:     Right Ear: Tympanic membrane and ear canal normal.     Left Ear: Tympanic membrane and ear canal normal.     Mouth/Throat:     Mouth: Mucous membranes are moist.     Pharynx: Oropharyngeal exudate and posterior oropharyngeal erythema present.  Cardiovascular:     Rate and Rhythm: Normal rate and regular rhythm.     Heart sounds: Normal heart sounds.  Pulmonary:     Effort: Pulmonary effort is normal.     Breath sounds: Normal breath sounds and air entry. No wheezing, rhonchi or rales.  Lymphadenopathy:     Cervical: Cervical adenopathy (mild left anterior adenopathy present) present.  Neurological:     Mental Status: She is alert.      UC Treatments / Results  Labs (all labs ordered are listed, but only abnormal results are displayed) Labs Reviewed  SARS CORONAVIRUS 2 (TAT 6-24 HRS)  CULTURE, GROUP A STREP [facility])  POCT INFLUENZA A/B  POCT RAPID STREP A (OFFICE)    EKG   Radiology No results found.  Procedures Procedures (including critical care time)  Medications Ordered in UC Medications - No data to display  Initial Impression / Assessment and Plan / UC Course  I have reviewed the triage vital signs and the nursing notes.  Pertinent labs & imaging results that were available during my care of the patient were reviewed by me and considered in my medical decision making (see chart for details).    Plan: The diagnosis will be treated with the following: 1.  Fever: A.  Advised take ibuprofen or Motrin as needed for fever and body discomfort. 2.  Sore throat: A.  Advised take ibuprofen or Motrin as needed for body aches and discomfort. 3.  Upper respiratory tract infection: A.  Advised to continue Phenergan DM, 1 teaspoon every 6 hours as needed for cough and congestion. B.  Advised to continue  Flonase nasal spray, 2 sprays each nostril  once daily to help control sinus congestion and drainage. 4.  Screening for COVID-19: A.  Treatment may be modified depending on results of the COVID test. 5.  Advised follow-up PCP or return to urgent care if symptoms fail to improve. Final Clinical Impressions(s) / UC Diagnoses   Final diagnoses:  Fever, unspecified  Sore throat  Encounter for screening for COVID-19  Acute upper respiratory infection     Discharge Instructions      Advised to continue taking the Promethazine DM, 1 teaspoon every 4 hours as needed for cough and congestion. Advised to continue to use the Flonase nasal spray, 2 sprays each nostril once daily to help decrease the sinus congestion and drainage.  Advised take ibuprofen or Motrin as needed for throat discomfort, fever, and body discomfort.  COVID screening will be completed in 48 hours.  If you do not get a call from this office that indicates the test is negative.  Log onto MyChart to be the test results from the post in 48 hours.  Advised follow-up PCP return to urgent care as needed.    ED Prescriptions   None    PDMP not reviewed this encounter.   Nyoka Lint, PA-C 07/15/22 1032

## 2022-07-15 NOTE — ED Triage Notes (Signed)
Pt sts body aches and congestion with HA x 3 days; pt sts was also sick last week but felt better then became sick again

## 2022-07-16 LAB — SARS CORONAVIRUS 2 (TAT 6-24 HRS): SARS Coronavirus 2: NEGATIVE

## 2022-07-18 LAB — CULTURE, GROUP A STREP (THRC)

## 2022-08-07 ENCOUNTER — Ambulatory Visit
Admission: EM | Admit: 2022-08-07 | Discharge: 2022-08-07 | Disposition: A | Discharge status: Home / Self Care | Payer: 59 | Attending: Internal Medicine | Admitting: Internal Medicine

## 2022-08-07 DIAGNOSIS — Z113 Encounter for screening for infections with a predominantly sexual mode of transmission: Secondary | ICD-10-CM | POA: Diagnosis present

## 2022-08-07 DIAGNOSIS — R3915 Urgency of urination: Secondary | ICD-10-CM | POA: Diagnosis present

## 2022-08-07 DIAGNOSIS — N926 Irregular menstruation, unspecified: Secondary | ICD-10-CM | POA: Diagnosis present

## 2022-08-07 DIAGNOSIS — Z3202 Encounter for pregnancy test, result negative: Secondary | ICD-10-CM | POA: Diagnosis present

## 2022-08-07 DIAGNOSIS — N898 Other specified noninflammatory disorders of vagina: Secondary | ICD-10-CM | POA: Insufficient documentation

## 2022-08-07 LAB — POCT URINALYSIS DIP (MANUAL ENTRY)
Bilirubin, UA: NEGATIVE
Blood, UA: NEGATIVE
Glucose, UA: NEGATIVE mg/dL
Ketones, POC UA: NEGATIVE mg/dL
Leukocytes, UA: NEGATIVE
Nitrite, UA: NEGATIVE
Protein Ur, POC: NEGATIVE mg/dL
Spec Grav, UA: >=1.03 — AB (ref 1.010–1.025)
Urobilinogen, UA: 0.2 E.U./dL
pH, UA: 6.5 (ref 5.0–8.0)

## 2022-08-07 LAB — POCT URINE PREGNANCY: Preg Test, Ur: NEGATIVE

## 2022-08-07 NOTE — ED Provider Notes (Signed)
[facility]    CSN: [id number] Arrival date & time: 08/07/22  1010      History   Chief Complaint Chief Complaint  Patient presents with   UTI    HPI Keilani A Halaby is a 27 y.o. female.   Patient presents with lower abdominal cramping, lower back pain, urinary urgency, vaginal discharge that started about 2 days ago.  Patient reports that vaginal discharge is a clear color.  She reports history of bacterial vaginosis that is recurrent but reports that this "feels different".  She denies any vaginal odor.  Denies hematuria, dysuria, urinary frequency, abnormal vaginal bleeding.  Reports last menstrual cycle was before Valentine's Day in early February but patient is not sure of exact date.  She states that she has missed menstrual cycles in the past.  She denies exposure to STD but has had unprotected sexual intercourse recently.  Patient was pregnant in October 2023 but reports that she terminated this pregnancy.     Past Medical History:  Diagnosis Date   Vitamin D deficiency     Patient Active Problem List   Diagnosis Date Noted   Abdominal discomfort 03/06/2016   Vaginal discharge 03/06/2016   Shoulder subluxation, right 03/26/2011    History reviewed. No pertinent surgical history.  OB History     Gravida  2   Para      Term      Preterm      AB      Living         SAB      IAB      Ectopic      Multiple      Live Births               Home Medications    Prior to Admission medications   Medication Sig Start Date End Date Taking? Authorizing Provider  fluticasone (FLONASE) 50 MCG/ACT nasal spray Place 2 sprays into both nostrils daily. 07/06/22   Mar Daring, PA-C  metroNIDAZOLE (FLAGYL) 500 MG tablet Take 1 tablet (500 mg total) by mouth 2 (two) times daily. 05/19/22   Chase Picket, MD  promethazine-dextromethorphan (PROMETHAZINE-DM) 6.25-15 MG/5ML syrup Take 5 mLs by mouth 4 (four) times daily as needed.  07/06/22   Mar Daring, PA-C  omeprazole (PRILOSEC) 20 MG capsule Take 1 capsule (20 mg total) by mouth daily. 12/15/18 07/01/19  Zigmund Gottron, NP    Family History Family History  Problem Relation Age of Onset   Diabetes Paternal Grandmother    Hypertension Paternal Grandmother    Diabetes Maternal Grandmother    Hypertension Paternal Grandfather    Heart disease Neg Hx    Stroke Neg Hx    Cancer Neg Hx     Social History Social History   Tobacco Use   Smoking status: Never   Smokeless tobacco: Never  Substance Use Topics   Alcohol use: No   Drug use: No     Allergies   Patient has no known allergies.   Review of Systems Review of Systems Per HPI  Physical Exam Triage Vital Signs ED Triage Vitals  Enc Vitals Group     BP 08/07/22 1029 116/66     Pulse Rate 08/07/22 1029 86     Resp 08/07/22 1029 18     Temp 08/07/22 1029 98.1 F (36.7 C)     Temp Source 08/07/22 1029 Oral     SpO2 08/07/22 1029 95 %  Weight --      Height --      Head Circumference --      Peak Flow --      Pain Score 08/07/22 1028 5     Pain Loc --      Pain Edu? --      Excl. in [place]? --    No data found.  Updated Vital Signs BP 116/66 (BP Location: Right Arm)   Pulse 86   Temp 98.1 F (36.7 C) (Oral)   Resp 18   LMP 06/15/2022   SpO2 95%   Visual Acuity Right Eye Distance:   Left Eye Distance:   Bilateral Distance:    Right Eye Near:   Left Eye Near:    Bilateral Near:     Physical Exam Constitutional:      General: She is not in acute distress.    Appearance: Normal appearance. She is not toxic-appearing or diaphoretic.  HENT:     Head: Normocephalic and atraumatic.  Eyes:     Extraocular Movements: Extraocular movements intact.     Conjunctiva/sclera: Conjunctivae normal.  Pulmonary:     Effort: Pulmonary effort is normal.  Genitourinary:    Comments: Deferred with shared decision making.  Self swab performed. Neurological:     General: No focal  deficit present.     Mental Status: She is alert and oriented to person, place, and time. Mental status is at baseline.  Psychiatric:        Mood and Affect: Mood normal.        Behavior: Behavior normal.        Thought Content: Thought content normal.        Judgment: Judgment normal.      UC Treatments / Results  Labs (all labs ordered are listed, but only abnormal results are displayed) Labs Reviewed  POCT URINALYSIS DIP (MANUAL ENTRY) - Abnormal; Notable for the following components:      Result Value   Spec Grav, UA >=1.030 (*)    All other components within normal limits  HCG, QUANTITATIVE, PREGNANCY  POCT URINE PREGNANCY  CERVICOVAGINAL ANCILLARY ONLY    EKG   Radiology No results found.  Procedures Procedures (including critical care time)  Medications Ordered in UC Medications - No data to display  Initial Impression / Assessment and Plan / UC Course  I have reviewed the triage vital signs and the nursing notes.  Pertinent labs & imaging results that were available during my care of the patient were reviewed by me and considered in my medical decision making (see chart for details).     UA not definitive for urinary tract infection.  Cervicovaginal swab pending.  Most likely vaginitis given vaginal discharge.  Given no confirmed exposure to STD and patient reporting that this feels different than her recurrent bacterial vaginitis, will await result for any further treatment.  Urine pregnancy test was negative.  Although, patient reports no menstrual cycle since early February so quantitative hCG is pending.  Advised follow-up with gynecology at provided contact information given missed menstrual cycle.  Advised to refrain from sexual activity until test results and treatment are complete.  Patient verbalized understanding and was agreeable with plan. Final Clinical Impressions(s) / UC Diagnoses   Final diagnoses:  Urinary urgency  Vaginal discharge  Screening  examination for venereal disease  Urine pregnancy test negative  Missed menses     Discharge Instructions      Urine does not show any signs of  urinary infection.  Urine pregnancy test was negative.  Vaginal swab and blood work for pregnancy is pending.  Will call if they are abnormal.  Please follow-up with gynecology for further evaluation and management.  Refrain from sexual activity until test results and treatment are complete.    ED Prescriptions   None    PDMP not reviewed this encounter.   Teodora Medici, [place] 08/07/22 [phone]

## 2022-08-07 NOTE — Discharge Instructions (Signed)
Urine does not show any signs of urinary infection.  Urine pregnancy test was negative.  Vaginal swab and blood work for pregnancy is pending.  Will call if they are abnormal.  Please follow-up with gynecology for further evaluation and management.  Refrain from sexual activity until test results and treatment are complete.

## 2022-08-07 NOTE — ED Triage Notes (Signed)
Pt presents with abdominal cramping and urinary urgency X 2 days.

## 2022-08-08 LAB — CERVICOVAGINAL ANCILLARY ONLY
Bacterial Vaginitis (gardnerella): NEGATIVE
Candida Glabrata: NEGATIVE
Candida Vaginitis: POSITIVE — AB
Chlamydia: NEGATIVE
Comment: NEGATIVE
Comment: NEGATIVE
Comment: NEGATIVE
Comment: NEGATIVE
Comment: NEGATIVE
Comment: NORMAL
Neisseria Gonorrhea: NEGATIVE
Trichomonas: NEGATIVE

## 2022-08-09 ENCOUNTER — Telehealth (HOSPITAL_COMMUNITY): Payer: Self-pay | Admitting: Emergency Medicine

## 2022-08-09 MED ORDER — FLUCONAZOLE 150 MG PO TABS: 150.0000 mg | ORAL_TABLET | Freq: Once | ORAL | 0 refills | Status: AC

## 2022-08-15 LAB — SPECIMEN STATUS REPORT

## 2022-08-15 LAB — BETA HCG QUANT (REF LAB): hCG Quant: <1 m[IU]/mL

## 2022-11-01 ENCOUNTER — Other Ambulatory Visit: Payer: Self-pay

## 2022-11-01 ENCOUNTER — Ambulatory Visit
Admission: RE | Admit: 2022-11-01 | Discharge: 2022-11-01 | Disposition: A | Discharge status: Home / Self Care | Payer: 59 | Source: Ambulatory Visit | Attending: Family Medicine | Admitting: Family Medicine

## 2022-11-01 VITALS — BP 126/78 | HR 90 | Temp 98.6°F | Resp 16

## 2022-11-01 DIAGNOSIS — N898 Other specified noninflammatory disorders of vagina: Secondary | ICD-10-CM | POA: Diagnosis present

## 2022-11-01 DIAGNOSIS — J0140 Acute pansinusitis, unspecified: Secondary | ICD-10-CM | POA: Insufficient documentation

## 2022-11-01 DIAGNOSIS — N760 Acute vaginitis: Secondary | ICD-10-CM | POA: Diagnosis present

## 2022-11-01 DIAGNOSIS — J029 Acute pharyngitis, unspecified: Secondary | ICD-10-CM | POA: Insufficient documentation

## 2022-11-01 LAB — POCT URINALYSIS DIP (MANUAL ENTRY)
Bilirubin, UA: NEGATIVE
Blood, UA: NEGATIVE
Glucose, UA: NEGATIVE mg/dL
Ketones, POC UA: NEGATIVE mg/dL
Leukocytes, UA: NEGATIVE
Nitrite, UA: NEGATIVE
Protein Ur, POC: NEGATIVE mg/dL
Spec Grav, UA: 1.025 (ref 1.010–1.025)
Urobilinogen, UA: 0.2 E.U./dL
pH, UA: 7 (ref 5.0–8.0)

## 2022-11-01 LAB — POCT URINE PREGNANCY: Preg Test, Ur: NEGATIVE

## 2022-11-01 MED ORDER — FLUCONAZOLE 150 MG PO TABS: 150.0000 mg | ORAL_TABLET | Freq: Every day | ORAL | 0 refills | Status: DC

## 2022-11-01 NOTE — ED Triage Notes (Signed)
Patient presents to [facility] for vaginal discharge and odor x 1 week ago. Hx of BV, yeast. Took 4 doses left over Tindamax. Last dose Tuesday.

## 2022-11-01 NOTE — Discharge Instructions (Signed)
Your vaginal cytology results will be available within the next 24 to 48 hours.  Your results will update to MyChart and if any additional treatment is warranted we will send to the pharmacy on file.  I am treating you for possible yeast infection with 1 dose of Diflucan.  Medication has been sent to your pharmacy.

## 2022-11-01 NOTE — ED Provider Notes (Signed)
[name]    CSN: [id number] Arrival date & time: 11/01/22  4098      History   Chief Complaint Chief Complaint  Patient presents with   Vaginal Discharge    Entered by patient   Abdominal Pain    HPI Miranda Schneider is a 27 y.o. female.   HPI Patient here today with a concern of abnormal vaginal discharge reports has an abnormal odor.  Patient has a history of bacterial vaginosis and yeast.  She has taken leftover Tindamax which she had leftover from a previous treatment of BV.  Reports odor resolved after she completed 4 doses of Tindamax.  However she continues to have an abnormal vaginal discharge.  She denies any itching.  She is concerned that she may have yeast.  Denies any knowledge of any exposures to any STDs.  Patient has a known history of recurrent BV and yeast. Past Medical History:  Diagnosis Date   Vitamin D deficiency     Patient Active Problem List   Diagnosis Date Noted   Acute pansinusitis 11/01/2022   Acute pharyngitis 11/01/2022   Low serum progesterone 02/06/2022   History of termination of pregnancy 02/01/2022   Marijuana use 02/01/2022   Prenatal care, subsequent pregnancy 02/01/2022   Smoker 07/09/2019   Abdominal discomfort 03/06/2016   Vaginal discharge 03/06/2016   Shoulder subluxation, right 03/26/2011    History reviewed. No pertinent surgical history.  OB History     Gravida  2   Para      Term      Preterm      AB      Living         SAB      IAB      Ectopic      Multiple      Live Births               Home Medications    Prior to Admission medications   Medication Sig Start Date End Date Taking? Authorizing Provider  fluconazole (DIFLUCAN) 150 MG tablet Take 1 tablet (150 mg total) by mouth daily. 11/01/22  Yes Bing Neighbors, NP  fluticasone (FLONASE) 50 MCG/ACT nasal spray Place 2 sprays into both nostrils daily. 07/06/22   Margaretann Loveless, PA-C  metroNIDAZOLE (FLAGYL) 500 MG  tablet Take 1 tablet (500 mg total) by mouth 2 (two) times daily. 05/19/22   Merrilee Jansky, MD  promethazine-dextromethorphan (PROMETHAZINE-DM) 6.25-15 MG/5ML syrup Take 5 mLs by mouth 4 (four) times daily as needed. 07/06/22   Margaretann Loveless, PA-C  omeprazole (PRILOSEC) 20 MG capsule Take 1 capsule (20 mg total) by mouth daily. 12/15/18 07/01/19  Georgetta Haber, NP    Family History Family History  Problem Relation Age of Onset   Diabetes Paternal Grandmother    Hypertension Paternal Grandmother    Diabetes Maternal Grandmother    Hypertension Paternal Grandfather    Heart disease Neg Hx    Stroke Neg Hx    Cancer Neg Hx     Social History Social History   Tobacco Use   Smoking status: Never   Smokeless tobacco: Never  Substance Use Topics   Alcohol use: No   Drug use: No     Allergies   Patient has no known allergies.   Review of Systems Review of Systems Pertinent negatives listed in HPI   Physical Exam Triage Vital Signs ED Triage Vitals  Enc Vitals Group  BP 11/01/22 1015 126/78     Pulse Rate 11/01/22 1015 90     Resp 11/01/22 1015 16     Temp 11/01/22 1015 98.6 F (37 C)     Temp Source 11/01/22 1015 Oral     SpO2 11/01/22 1015 97 %     Weight --      Height --      Head Circumference --      Peak Flow --      Pain Score 11/01/22 1014 6     Pain Loc --      Pain Edu? --      Excl. in [place]? --    No data found.  Updated Vital Signs BP 126/78 (BP Location: Left Arm)   Pulse 90   Temp 98.6 F (37 C) (Oral)   Resp 16   LMP 10/05/2022 (Approximate)   SpO2 97%   Visual Acuity Right Eye Distance:   Left Eye Distance:   Bilateral Distance:    Right Eye Near:   Left Eye Near:    Bilateral Near:     Physical Exam General appearance: Alert, well developed, well nourished, cooperative  Head: Normocephalic, without obvious abnormality, atraumatic Heart: Rate and rhythm normal. No gallop or murmurs noted on exam  Respiratory:  Respirations even and unlabored, normal respiratory rate Extremities: No gross deformities Psych: Appropriate mood and affect. Vaginal Cytology Self Collected  UC Treatments / Results  Labs (all labs ordered are listed, but only abnormal results are displayed) Labs Reviewed  POCT URINALYSIS DIP (MANUAL ENTRY) - Abnormal; Notable for the following components:      Result Value   Clarity, UA hazy (*)    All other components within normal limits  POCT URINE PREGNANCY  CERVICOVAGINAL ANCILLARY ONLY    EKG   Radiology No results found.  Procedures Procedures (including critical care time)  Medications Ordered in UC Medications - No data to display  Initial Impression / Assessment and Plan / UC Course  I have reviewed the triage vital signs and the nursing notes.  Pertinent labs & imaging results that were available during my care of the patient were reviewed by me and considered in my medical decision making (see chart for details).    Abnormal vaginal discharge with vaginitis symptoms, vaginal cytology is pending.  Will treat with Diflucan one-time dose while awaiting cytology results.  Patient had no concerns for STDs therefore HIV and RPR not ordered.  Patient informed that results can take up to 24 to 48 hours and she will be notified via MyChart if any treatment is warranted. Final Clinical Impressions(s) / UC Diagnoses   Final diagnoses:  Vaginal discharge  Vaginitis and vulvovaginitis     Discharge Instructions      Your vaginal cytology results will be available within the next 24 to 48 hours.  Your results will update to MyChart and if any additional treatment is warranted we will send to the pharmacy on file.  I am treating you for possible yeast infection with 1 dose of Diflucan.  Medication has been sent to your pharmacy.     ED Prescriptions     Medication Sig Dispense Auth. Provider   fluconazole (DIFLUCAN) 150 MG tablet Take 1 tablet (150 mg total) by  mouth daily. 1 tablet Bing Neighbors, NP      PDMP not reviewed this encounter.   Bing Neighbors, NP 11/01/22 [phone]

## 2022-11-02 ENCOUNTER — Telehealth (HOSPITAL_COMMUNITY): Payer: Self-pay | Admitting: Emergency Medicine

## 2022-11-02 LAB — CERVICOVAGINAL ANCILLARY ONLY
Bacterial Vaginitis (gardnerella): POSITIVE — AB
Candida Glabrata: NEGATIVE
Candida Vaginitis: NEGATIVE
Chlamydia: NEGATIVE
Comment: NEGATIVE
Comment: NEGATIVE
Comment: NEGATIVE
Comment: NEGATIVE
Comment: NEGATIVE
Comment: NORMAL
Neisseria Gonorrhea: NEGATIVE
Trichomonas: NEGATIVE

## 2022-11-02 MED ORDER — METRONIDAZOLE 500 MG PO TABS: 500.0000 mg | ORAL_TABLET | Freq: Two times a day (BID) | ORAL | 0 refills | Status: DC
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
## 2023-03-01 ENCOUNTER — Ambulatory Visit
Admission: EM | Admit: 2023-03-01 | Discharge: 2023-03-01 | Disposition: A | Discharge status: Home / Self Care | Payer: 59 | Attending: Internal Medicine | Admitting: Internal Medicine

## 2023-03-01 DIAGNOSIS — N898 Other specified noninflammatory disorders of vagina: Secondary | ICD-10-CM | POA: Insufficient documentation

## 2023-03-01 MED ORDER — FLUCONAZOLE 150 MG PO TABS: 150.0000 mg | ORAL_TABLET | Freq: Once | ORAL | 0 refills | Status: AC

## 2023-03-01 MED ORDER — METRONIDAZOLE 500 MG PO TABS: 500.0000 mg | ORAL_TABLET | Freq: Two times a day (BID) | ORAL | 0 refills | Status: DC

## 2023-03-01 NOTE — ED Provider Notes (Signed)
[name]    CSN: [id number] Arrival date & time: 03/01/23  1008      History   Chief Complaint Chief Complaint  Patient presents with   Vaginal Discharge    HPI Miranda Schneider is a 27 y.o. female.   27 yr old female who presents to urgent care with symptoms of vaginal discharge, odor, pain. Gets BV frequently and has the same symptoms with it. Usually takes probiotics with her BV which helps with the smell but doesn't really take the vaginal symptoms away. She just started on the probiotics so hasn't really tried it long term for her BV. Long history of having recurrent BV. Denies fevers or chills. Light cramping. Did find out she is pregnant when at the gyn recently due to the recurrent BV. Patient is scheduled for an abortion tomorrow at [facility].   Vaginal Discharge Associated symptoms: no abdominal pain, no dysuria, no fever and no vomiting     Past Medical History:  Diagnosis Date   Vitamin D deficiency     Patient Active Problem List   Diagnosis Date Noted   Acute pansinusitis 11/01/2022   Acute pharyngitis 11/01/2022   Low serum progesterone 02/06/2022   History of termination of pregnancy 02/01/2022   Marijuana use 02/01/2022   Prenatal care, subsequent pregnancy 02/01/2022   Smoker 07/09/2019   Abdominal discomfort 03/06/2016   Vaginal discharge 03/06/2016   Shoulder subluxation, right 03/26/2011    History reviewed. No pertinent surgical history.  OB History     Gravida  3   Para      Term      Preterm      AB      Living         SAB      IAB      Ectopic      Multiple      Live Births               Home Medications    Prior to Admission medications   Medication Sig Start Date End Date Taking? Authorizing Provider  omeprazole (PRILOSEC) 20 MG capsule Take 1 capsule (20 mg total) by mouth daily. 12/15/18 07/01/19  Georgetta Haber, NP    Family History Family History  Problem Relation Age of Onset    Diabetes Paternal Grandmother    Hypertension Paternal Grandmother    Diabetes Maternal Grandmother    Hypertension Paternal Grandfather    Heart disease Neg Hx    Stroke Neg Hx    Cancer Neg Hx     Social History Social History   Tobacco Use   Smoking status: Never   Smokeless tobacco: Never  Substance Use Topics   Alcohol use: No   Drug use: No     Allergies   Patient has no known allergies.   Review of Systems Review of Systems  Constitutional:  Negative for chills and fever.  HENT:  Negative for ear pain and sore throat.   Eyes:  Negative for pain and visual disturbance.  Respiratory:  Negative for cough and shortness of breath.   Cardiovascular:  Negative for chest pain and palpitations.  Gastrointestinal:  Negative for abdominal pain and vomiting.  Genitourinary:  Positive for vaginal discharge. Negative for dysuria and hematuria.  Musculoskeletal:  Negative for arthralgias and back pain.  Skin:  Negative for color change and rash.  Neurological:  Negative for seizures and syncope.  All other systems reviewed and are negative.  Physical Exam Triage Vital Signs ED Triage Vitals [03/01/23 1052]  Encounter Vitals Group     BP (!) 121/53     Systolic BP Percentile      Diastolic BP Percentile      Pulse Rate 84     Resp 16     Temp 98.5 F (36.9 C)     Temp Source Oral     SpO2 98 %     Weight 211 lb (95.7 kg)     Height 5\' 1"  (1.549 m)     Head Circumference      Peak Flow      Pain Score 0     Pain Loc      Pain Education      Exclude from Growth Chart    No data found.  Updated Vital Signs BP (!) 121/53 (BP Location: Left Arm)   Pulse 84   Temp 98.5 F (36.9 C) (Oral)   Resp 16   Ht 5\' 1"  (1.549 m)   Wt 211 lb (95.7 kg)   SpO2 98%   BMI 39.87 kg/m   Visual Acuity Right Eye Distance:   Left Eye Distance:   Bilateral Distance:    Right Eye Near:   Left Eye Near:    Bilateral Near:     Physical Exam Vitals and nursing note  reviewed.  Constitutional:      General: She is not in acute distress.    Appearance: She is well-developed.  HENT:     Head: Normocephalic and atraumatic.  Eyes:     Conjunctiva/sclera: Conjunctivae normal.  Cardiovascular:     Rate and Rhythm: Normal rate and regular rhythm.     Heart sounds: No murmur heard. Pulmonary:     Effort: Pulmonary effort is normal. No respiratory distress.     Breath sounds: Normal breath sounds.  Abdominal:     Palpations: Abdomen is soft.     Tenderness: There is no abdominal tenderness.  Musculoskeletal:        General: No swelling.     Cervical back: Neck supple.  Skin:    General: Skin is warm and dry.     Capillary Refill: Capillary refill takes less than 2 seconds.  Neurological:     Mental Status: She is alert.  Psychiatric:        Mood and Affect: Mood normal.      UC Treatments / Results  Labs (all labs ordered are listed, but only abnormal results are displayed) Labs Reviewed - No data to display  EKG   Radiology No results found.  Procedures Procedures (including critical care time)  Medications Ordered in UC Medications - No data to display  Initial Impression / Assessment and Plan / UC Course  I have reviewed the triage vital signs and the nursing notes.  Pertinent labs & imaging results that were available during my care of the patient were reviewed by me and considered in my medical decision making (see chart for details).     Vaginal discharge   We are treating you for bacterial vaginosis.  Please take metronidazole twice daily for 7 days.  Do not drink any alcohol with this medication for 3 days after completing course as it will cause you to vomit.    Diflucan 150 mg take 2-3 days after starting antibiotics and then repeat in 3 days if symptoms occur or persist  We will contact you if we need to arrange additional treatment based on your testing.  Abstain from sex until you receive your final results.  Use a  condom for the sexual encounter.  Use hypoallergenic soaps and detergents and wear loosefitting cotton underwear.  If you have any worsening or changing symptoms including abnormal discharge, pelvic pain, abdominal pain, fever, nausea, vomiting you should be reevaluated.   Final Clinical Impressions(s) / UC Diagnoses   Final diagnoses:  None   Discharge Instructions   None    ED Prescriptions   None    PDMP not reviewed this encounter.   Landis Martins, [place] 03/01/23 1131

## 2023-03-01 NOTE — Discharge Instructions (Addendum)
We are treating you for bacterial vaginosis.  Please take metronidazole twice daily for 7 days.  Do not drink any alcohol with this medication for 3 days after completing course as it will cause you to vomit.    Diflucan 150 mg take 2-3 days after starting antibiotics and then repeat in 3 days if symptoms occur or persist  We will contact you if we need to arrange additional treatment based on your testing. Abstain from sex until you receive your final results.  Use a condom for the sexual encounter.  Use hypoallergenic soaps and detergents and wear loosefitting cotton underwear.  If you have any worsening or changing symptoms including abnormal discharge, pelvic pain, abdominal pain, fever, nausea, vomiting you should be reevaluated.

## 2023-03-01 NOTE — ED Triage Notes (Signed)
Patient here today with c/o vaginal discharge and odor. She states that she gets BV often. She is having an abortion tomorrow and was advised to get treatment before.

## 2023-03-04 LAB — CERVICOVAGINAL ANCILLARY ONLY
Bacterial Vaginitis (gardnerella): POSITIVE — AB
Chlamydia: NEGATIVE
Comment: NEGATIVE
Comment: NEGATIVE
Comment: NEGATIVE
Comment: NORMAL
Neisseria Gonorrhea: NEGATIVE
Trichomonas: NEGATIVE

## 2023-04-01 ENCOUNTER — Telehealth: Payer: 59 | Admitting: Family Medicine

## 2023-04-01 DIAGNOSIS — B379 Candidiasis, unspecified: Secondary | ICD-10-CM | POA: Diagnosis not present

## 2023-04-01 DIAGNOSIS — T3695XA Adverse effect of unspecified systemic antibiotic, initial encounter: Secondary | ICD-10-CM

## 2023-04-01 DIAGNOSIS — U071 COVID-19: Secondary | ICD-10-CM | POA: Diagnosis not present

## 2023-04-01 MED ORDER — BENZONATATE 100 MG PO CAPS
100.0000 mg | ORAL_CAPSULE | Freq: Three times a day (TID) | ORAL | 0 refills | Status: AC | PRN
Start: 2023-04-01 — End: ?

## 2023-04-01 MED ORDER — FLUCONAZOLE 150 MG PO TABS
150.0000 mg | ORAL_TABLET | Freq: Every day | ORAL | 0 refills | Status: DC
Start: 2023-04-04 — End: 2023-06-07

## 2023-04-01 MED ORDER — PSEUDOEPH-BROMPHEN-DM 30-2-10 MG/5ML PO SYRP
5.0000 mL | ORAL_SOLUTION | Freq: Four times a day (QID) | ORAL | 0 refills | Status: DC | PRN
Start: 2023-04-01 — End: 2023-08-22

## 2023-04-01 MED ORDER — AMOXICILLIN-POT CLAVULANATE 875-125 MG PO TABS
1.0000 | ORAL_TABLET | Freq: Two times a day (BID) | ORAL | 0 refills | Status: AC
Start: 2023-04-04 — End: 2023-04-11

## 2023-04-01 NOTE — Patient Instructions (Signed)
Laurabelle Burtis Junes, thank you for joining Freddy Finner, NP for today's virtual visit.  While this provider is not your primary care provider (PCP), if your PCP is located in our provider database this encounter information will be shared with them immediately following your visit.   A [facility] MyChart account gives you access to today's visit and all your visits, tests, and labs performed at [facility] " click here if you don't have a [facility] MyChart account or go to [email or web address]  Consent: (Patient) Joannie A Bertoli provided verbal consent for this virtual visit at the beginning of the encounter.  Current Medications:  Current Outpatient Medications:    [START ON 04/04/2023] amoxicillin-clavulanate (AUGMENTIN) 875-125 MG tablet, Take 1 tablet by mouth 2 (two) times daily for 7 days., Disp: 14 tablet, Rfl: 0   benzonatate (TESSALON) 100 MG capsule, Take 1 capsule (100 mg total) by mouth 3 (three) times daily as needed for cough., Disp: 30 capsule, Rfl: 0   brompheniramine-pseudoephedrine-DM 30-2-10 MG/5ML syrup, Take 5 mLs by mouth 4 (four) times daily as needed., Disp: 120 mL, Rfl: 0   [START ON 04/04/2023] fluconazole (DIFLUCAN) 150 MG tablet, Take 1 tablet (150 mg total) by mouth daily., Disp: 1 tablet, Rfl: 0   Medications ordered in this encounter:  Meds ordered this encounter  Medications   brompheniramine-pseudoephedrine-DM 30-2-10 MG/5ML syrup    Sig: Take 5 mLs by mouth 4 (four) times daily as needed.    Dispense:  120 mL    Refill:  0    Order Specific Question:   Supervising Provider    Answer:   Merrilee Jansky [[id number]]   benzonatate (TESSALON) 100 MG capsule    Sig: Take 1 capsule (100 mg total) by mouth 3 (three) times daily as needed for cough.    Dispense:  30 capsule    Refill:  0    Order Specific Question:   Supervising Provider    Answer:   Merrilee Jansky [id number]   amoxicillin-clavulanate (AUGMENTIN) 875-125 MG tablet     Sig: Take 1 tablet by mouth 2 (two) times daily for 7 days.    Dispense:  14 tablet    Refill:  0    Order Specific Question:   Supervising Provider    Answer:   Merrilee Jansky [[id number]]   fluconazole (DIFLUCAN) 150 MG tablet    Sig: Take 1 tablet (150 mg total) by mouth daily.    Dispense:  1 tablet    Refill:  0    Order Specific Question:   Supervising Provider    Answer:   Merrilee Jansky [id number]     *If you need refills on other medications prior to your next appointment, please contact your pharmacy*  Follow-Up: Call back or seek an in-person evaluation if the symptoms worsen or if the condition fails to improve as anticipated.  [facility] [phone]  Care Instructions:  - Continue OTC symptomatic management of choice  - Take prescribed medications as directed (delayed Antibiotic if not improved at day 9-10 - Push fluids - Rest as needed - Discussed return precautions and when to seek in-person evaluation,    Isolation Instructions: You are to isolate at home until you have been fever free for at least 24 hours without a fever-reducing medication, and symptoms have been steadily improving for 24 hours. At that time,  you can end isolation but need to mask for an additional 5 days.   If  you must be around other household members who do not have symptoms, you need to make sure that both you and the family members are masking consistently with a high-quality mask.  If you note any worsening of symptoms despite treatment, please seek an in-person evaluation ASAP. If you note any significant shortness of breath or any chest pain, please seek ER evaluation. Please do not delay care!   COVID-19: What to Do if You Are Sick If you test positive and are an older adult or someone who is at high risk of getting very sick from COVID-19, treatment may be available. Contact a healthcare provider right away after a positive test to determine if you are eligible,  even if your symptoms are mild right now. You can also visit a Test to Treat location and, if eligible, receive a prescription from a provider. Don't delay: Treatment must be started within the first few days to be effective. If you have a fever, cough, or other symptoms, you might have COVID-19. Most people have mild illness and are able to recover at home. If you are sick: Keep track of your symptoms. If you have an emergency warning sign (including trouble breathing), call 911. Steps to help prevent the spread of COVID-19 if you are sick If you are sick with COVID-19 or think you might have COVID-19, follow the steps below to care for yourself and to help protect other people in your home and community. Stay home except to get medical care Stay home. Most people with COVID-19 have mild illness and can recover at home without medical care. Do not leave your home, except to get medical care. Do not visit public areas and do not go to places where you are unable to wear a mask. Take care of yourself. Get rest and stay hydrated. Take over-the-counter medicines, such as acetaminophen, to help you feel better. Stay in touch with your doctor. Call before you get medical care. Be sure to get care if you have trouble breathing, or have any other emergency warning signs, or if you think it is an emergency. Avoid public transportation, ride-sharing, or taxis if possible. Get tested If you have symptoms of COVID-19, get tested. While waiting for test results, stay away from others, including staying apart from those living in your household. Get tested as soon as possible after your symptoms start. Treatments may be available for people with COVID-19 who are at risk for becoming very sick. Don't delay: Treatment must be started early to be effective--some treatments must begin within 5 days of your first symptoms. Contact your healthcare provider right away if your test result is positive to determine if you are  eligible. Self-tests are one of several options for testing for the virus that causes COVID-19 and may be more convenient than laboratory-based tests and point-of-care tests. Ask your healthcare provider or your local health department if you need help interpreting your test results. You can visit your state, tribal, local, and territorial health department's website to look for the latest local information on testing sites. Separate yourself from other people As much as possible, stay in a specific room and away from other people and pets in your home. If possible, you should use a separate bathroom. If you need to be around other people or animals in or outside of the home, wear a well-fitting mask. Tell your close contacts that they may have been exposed to COVID-19. An infected person can spread COVID-19 starting 48 hours (or 2  days) before the person has any symptoms or tests positive. By letting your close contacts know they may have been exposed to COVID-19, you are helping to protect everyone. See COVID-19 and Animals if you have questions about pets. If you are diagnosed with COVID-19, someone from the health department may call you. Answer the call to slow the spread. Monitor your symptoms Symptoms of COVID-19 include fever, cough, or other symptoms. Follow care instructions from your healthcare provider and local health department. Your local health authorities may give instructions on checking your symptoms and reporting information. When to seek emergency medical attention Look for emergency warning signs* for COVID-19. If someone is showing any of these signs, seek emergency medical care immediately: Trouble breathing Persistent pain or pressure in the chest New confusion Inability to wake or stay awake Pale, gray, or blue-colored skin, lips, or nail beds, depending on skin tone *This list is not all possible symptoms. Please call your medical provider for any other symptoms that are  severe or concerning to you. Call 911 or call ahead to your local emergency facility: Notify the operator that you are seeking care for someone who has or may have COVID-19. Call ahead before visiting your doctor Call ahead. Many medical visits for routine care are being postponed or done by phone or telemedicine. If you have a medical appointment that cannot be postponed, call your doctor's office, and tell them you have or may have COVID-19. This will help the office protect themselves and other patients. If you are sick, wear a well-fitting mask You should wear a mask if you must be around other people or animals, including pets (even at home). Wear a mask with the best fit, protection, and comfort for you. You don't need to wear the mask if you are alone. If you can't put on a mask (because of trouble breathing, for example), cover your coughs and sneezes in some other way. Try to stay at least 6 feet away from other people. This will help protect the people around you. Masks should not be placed on young children under age 41 years, anyone who has trouble breathing, or anyone who is not able to remove the mask without help. Cover your coughs and sneezes Cover your mouth and nose with a tissue when you cough or sneeze. Throw away used tissues in a lined trash can. Immediately wash your hands with soap and water for at least 20 seconds. If soap and water are not available, clean your hands with an alcohol-based hand sanitizer that contains at least 60% alcohol. Clean your hands often Wash your hands often with soap and water for at least 20 seconds. This is especially important after blowing your nose, coughing, or sneezing; going to the bathroom; and before eating or preparing food. Use hand sanitizer if soap and water are not available. Use an alcohol-based hand sanitizer with at least 60% alcohol, covering all surfaces of your hands and rubbing them together until they feel dry. Soap and water  are the best option, especially if hands are visibly dirty. Avoid touching your eyes, nose, and mouth with unwashed hands. Handwashing Tips Avoid sharing personal household items Do not share dishes, drinking glasses, cups, eating utensils, towels, or bedding with other people in your home. Wash these items thoroughly after using them with soap and water or put in the dishwasher. Clean surfaces in your home regularly Clean and disinfect high-touch surfaces (for example, doorknobs, tables, handles, light switches, and countertops) in your "sick  room" and bathroom. In shared spaces, you should clean and disinfect surfaces and items after each use by the person who is ill. If you are sick and cannot clean, a caregiver or other person should only clean and disinfect the area around you (such as your bedroom and bathroom) on an as needed basis. Your caregiver/other person should wait as long as possible (at least several hours) and wear a mask before entering, cleaning, and disinfecting shared spaces that you use. Clean and disinfect areas that may have blood, stool, or body fluids on them. Use household cleaners and disinfectants. Clean visible dirty surfaces with household cleaners containing soap or detergent. Then, use a household disinfectant. Use a product from [employer] List N: Disinfectants for Coronavirus (COVID-19). Be sure to follow the instructions on the label to ensure safe and effective use of the product. Many products recommend keeping the surface wet with a disinfectant for a certain period of time (look at "contact time" on the product label). You may also need to wear personal protective equipment, such as gloves, depending on the directions on the product label. Immediately after disinfecting, wash your hands with soap and water for 20 seconds. For completed guidance on cleaning and disinfecting your home, visit Complete Disinfection Guidance. Take steps to improve ventilation at  home Improve ventilation (air flow) at home to help prevent from spreading COVID-19 to other people in your household. Clear out COVID-19 virus particles in the air by opening windows, using air filters, and turning on fans in your home. Use this interactive tool to learn how to improve air flow in your home. When you can be around others after being sick with COVID-19 Deciding when you can be around others is different for different situations. Find out when you can safely end home isolation. For any additional questions about your care, contact your healthcare provider or state or local health department. 08/02/2020 Content source: [facility] for Immunization and Respiratory Diseases ([facility]), Division of Viral Diseases This information is not intended to replace advice given to you by your health care provider. Make sure you discuss any questions you have with your health care provider. Document Revised: 09/15/2020 Document Reviewed: 09/15/2020 Elsevier Patient Education  2022 [employer].     If you have been instructed to have an in-person evaluation today at a local Urgent Care facility, please use the link below. It will take you to a list of all of our available [facility], including address, phone number and hours of operation. Please do not delay care.  [facility]  If you or a family member do not have a primary care provider, use the link below to schedule a visit and establish care. When you choose a [facility] primary care physician or advanced practice provider, you gain a long-term partner in health. Find a Primary Care Provider  Learn more about [facility]'s in-office and virtual care options: [place] - Get Care Now

## 2023-04-01 NOTE — Progress Notes (Signed)
Virtual Visit Consent   Miranda Schneider, you are scheduled for a virtual visit with a [facility] provider today. Just as with appointments in the office, your consent must be obtained to participate. Your consent will be active for this visit and any virtual visit you may have with one of our providers in the next 365 days. If you have a MyChart account, a copy of this consent can be sent to you electronically.  As this is a virtual visit, video technology does not allow for your provider to perform a traditional examination. This may limit your provider's ability to fully assess your condition. If your provider identifies any concerns that need to be evaluated in person or the need to arrange testing (such as labs, EKG, etc.), we will make arrangements to do so. Although advances in technology are sophisticated, we cannot ensure that it will always work on either your end or our end. If the connection with a video visit is poor, the visit may have to be switched to a telephone visit. With either a video or telephone visit, we are not always able to ensure that we have a secure connection.  By engaging in this virtual visit, you consent to the provision of healthcare and authorize for your insurance to be billed (if applicable) for the services provided during this visit. Depending on your insurance coverage, you may receive a charge related to this service.  I need to obtain your verbal consent now. Are you willing to proceed with your visit today? Miranda Schneider has provided verbal consent on 04/01/2023 for a virtual visit (video or telephone). Freddy Finner, NP  Date: 04/01/2023 11:58 AM  Virtual Visit via Video Note   I, Freddy Finner, connected with  Miranda Schneider  ([id number], [birth year]) on 04/01/23 at 11:45 AM EST by a video-enabled telemedicine application and verified that I am speaking with the correct person using two identifiers.  Location: Patient: Virtual Visit Location  Patient: Home Provider: Virtual Visit Location Provider: Home Office   I discussed the limitations of evaluation and management by telemedicine and the availability of in person appointments. The patient expressed understanding and agreed to proceed.    History of Present Illness: Miranda Schneider is a 27 y.o. who identifies as a female who was assigned female at birth, and is being seen today for covid  Onset was last week- 6 days ago -Weds with a sore throat, diarrhea, and fever- 102  Associated symptoms are congestion, cough, chills (no fever anymore) sore throat, diarrhea, tired Modifying factors are tylenol and thera flu and throat spray Denies chest pain, shortness of breath, fevers,   Exposure to sick contacts- known- mother was + last week COVID test: + 5 days ago Vaccines: covid first series no booster   Problems:  Patient Active Problem List   Diagnosis Date Noted   Acute pansinusitis 11/01/2022   Acute pharyngitis 11/01/2022   Low serum progesterone 02/06/2022   History of termination of pregnancy 02/01/2022   Marijuana use 02/01/2022   Prenatal care, subsequent pregnancy 02/01/2022   Smoker 07/09/2019   Abdominal discomfort 03/06/2016   Vaginal discharge 03/06/2016   Shoulder subluxation, right 03/26/2011    Allergies: No Known Allergies Medications:  Current Outpatient Medications:    metroNIDAZOLE (FLAGYL) 500 MG tablet, Take 1 tablet (500 mg total) by mouth 2 (two) times daily., Disp: 14 tablet, Rfl: 0  Observations/Objective: Patient is well-developed, well-nourished in no acute distress.  Resting comfortably  at home.  Head is normocephalic, atraumatic.  No labored breathing.  Speech is clear and coherent with logical content.  Patient is alert and oriented at baseline.  Congestion and cough noted   Assessment and Plan:   1. COVID-19  - brompheniramine-pseudoephedrine-DM 30-2-10 MG/5ML syrup; Take 5 mLs by mouth 4 (four) times daily as needed.   Dispense: 120 mL; Refill: 0 - benzonatate (TESSALON) 100 MG capsule; Take 1 capsule (100 mg total) by mouth 3 (three) times daily as needed for cough.  Dispense: 30 capsule; Refill: 0 - amoxicillin-clavulanate (AUGMENTIN) 875-125 MG tablet; Take 1 tablet by mouth 2 (two) times daily for 7 days.  Dispense: 14 tablet; Refill: 0  2. Antibiotic-induced yeast infection  - fluconazole (DIFLUCAN) 150 MG tablet; Take 1 tablet (150 mg total) by mouth daily.  Dispense: 1 tablet; Refill: 0   - Continue OTC symptomatic management of choice - Info for COVID sent on AVS as well - Take prescribed medications as directed - Push fluids - Rest as needed - Discussed return precautions and when to seek in-person evaluation, sent via AVS as well  Reviewed side effects, risks and benefits of medication.    Patient acknowledged agreement and understanding of the plan.   Past Medical, Surgical, Social History, Allergies, and Medications have been Reviewed.    Follow Up Instructions: I discussed the assessment and treatment plan with the patient. The patient was provided an opportunity to ask questions and all were answered. The patient agreed with the plan and demonstrated an understanding of the instructions.  A copy of instructions were sent to the patient via MyChart unless otherwise noted below.    The patient was advised to call back or seek an in-person evaluation if the symptoms worsen or if the condition fails to improve as anticipated.    Freddy Finner, NP

## 2023-05-03 ENCOUNTER — Ambulatory Visit: Payer: Self-pay

## 2023-06-06 ENCOUNTER — Ambulatory Visit: Payer: Self-pay

## 2023-06-07 ENCOUNTER — Ambulatory Visit
Admission: RE | Admit: 2023-06-07 | Discharge: 2023-06-07 | Disposition: A | Discharge status: Home / Self Care | Payer: 59 | Source: Ambulatory Visit | Attending: Family Medicine | Admitting: Family Medicine

## 2023-06-07 VITALS — BP 118/74 | HR 89 | Temp 98.6°F | Resp 16

## 2023-06-07 DIAGNOSIS — L739 Follicular disorder, unspecified: Secondary | ICD-10-CM | POA: Insufficient documentation

## 2023-06-07 DIAGNOSIS — Z113 Encounter for screening for infections with a predominantly sexual mode of transmission: Secondary | ICD-10-CM | POA: Diagnosis not present

## 2023-06-07 DIAGNOSIS — N760 Acute vaginitis: Secondary | ICD-10-CM | POA: Insufficient documentation

## 2023-06-07 MED ORDER — FLUCONAZOLE 150 MG PO TABS
150.0000 mg | ORAL_TABLET | Freq: Every day | ORAL | 0 refills | Status: DC
Start: 2023-06-07 — End: 2023-08-21

## 2023-06-07 MED ORDER — SULFAMETHOXAZOLE-TRIMETHOPRIM 800-160 MG PO TABS
1.0000 | ORAL_TABLET | Freq: Two times a day (BID) | ORAL | 0 refills | Status: AC
Start: 2023-06-07 — End: 2023-06-14

## 2023-06-07 MED ORDER — CLINDAMYCIN PHOSPHATE 2 % VA CREA: 1.0000 | TOPICAL_CREAM | Freq: Every day | VAGINAL | 0 refills | Status: DC

## 2023-06-07 NOTE — ED Triage Notes (Signed)
Pt presents to UC for c/o bumps in groin area. Pt reports the first bump is near vagina since 2 weeks ago. Two more bumps appear on abdomen and inner right leg x 2 nights.  Pt reports vaginal odor and discharge x 2-3 days.

## 2023-06-07 NOTE — ED Provider Notes (Signed)
[facility]    CSN: [id number] Arrival date & time: 06/07/23  1827      History   Chief Complaint No chief complaint on file.   HPI Miranda Schneider is a 28 y.o. female presents for vaginal discharge.  Patient reports 2 to 3 days of a malodorous vaginal discharge.  States it is consistent with previous BV infections which she states she has chronic issues with.  No dysuria, STD exposure or concern but she would like screening while here.  In addition she reports some "bumps" on her groin and vaginal area for 2 weeks after shaving the area.  States they feel firm slightly tender but no drainage, swelling, warmth, fevers or chills.  Denies history of MRSA or abscesses in the past.  No OTC medications have been used to treat the symptoms.  No other concerns at this time  HPI  Past Medical History:  Diagnosis Date   Vitamin D deficiency     Patient Active Problem List   Diagnosis Date Noted   Acute pansinusitis 11/01/2022   Acute pharyngitis 11/01/2022   Low serum progesterone 02/06/2022   History of termination of pregnancy 02/01/2022   Marijuana use 02/01/2022   Prenatal care, subsequent pregnancy 02/01/2022   Smoker 07/09/2019   Abdominal discomfort 03/06/2016   Vaginal discharge 03/06/2016   Shoulder subluxation, right 03/26/2011    History reviewed. No pertinent surgical history.  OB History     Gravida  3   Para      Term      Preterm      AB      Living         SAB      IAB      Ectopic      Multiple      Live Births               Home Medications    Prior to Admission medications   Medication Sig Start Date End Date Taking? Authorizing Provider  clindamycin (CLEOCIN) 2 % vaginal cream Place 1 Applicatorful vaginally at bedtime. 06/07/23  Yes Radford Pax, NP  fluconazole (DIFLUCAN) 150 MG tablet Take 1 tablet (150 mg total) by mouth daily. Take 1 tablet on first day of antibiotics and then second tablet on fifth day of  antibiotics 06/07/23  Yes Radford Pax, NP  sulfamethoxazole-trimethoprim (BACTRIM DS) 800-160 MG tablet Take 1 tablet by mouth 2 (two) times daily for 7 days. 06/07/23 06/14/23 Yes Radford Pax, NP  benzonatate (TESSALON) 100 MG capsule Take 1 capsule (100 mg total) by mouth 3 (three) times daily as needed for cough. 04/01/23   Freddy Finner, NP  brompheniramine-pseudoephedrine-DM 30-2-10 MG/5ML syrup Take 5 mLs by mouth 4 (four) times daily as needed. 04/01/23   Freddy Finner, NP  omeprazole (PRILOSEC) 20 MG capsule Take 1 capsule (20 mg total) by mouth daily. 12/15/18 07/01/19  Georgetta Haber, NP    Family History Family History  Problem Relation Age of Onset   Diabetes Paternal Grandmother    Hypertension Paternal Grandmother    Diabetes Maternal Grandmother    Hypertension Paternal Grandfather    Heart disease Neg Hx    Stroke Neg Hx    Cancer Neg Hx     Social History Social History   Tobacco Use   Smoking status: Never   Smokeless tobacco: Never  Substance Use Topics   Alcohol use: No   Drug use: No  Allergies   Patient has no known allergies.   Review of Systems Review of Systems  Genitourinary:  Positive for vaginal discharge.       Bumps on groin/vagina     Physical Exam Triage Vital Signs ED Triage Vitals  Encounter Vitals Group     BP 06/07/23 1904 118/74     Systolic BP Percentile --      Diastolic BP Percentile --      Pulse Rate 06/07/23 1904 89     Resp 06/07/23 1904 16     Temp 06/07/23 1904 98.6 F (37 C)     Temp Source 06/07/23 1904 Oral     SpO2 06/07/23 1904 97 %     Weight --      Height --      Head Circumference --      Peak Flow --      Pain Score 06/07/23 1858 0     Pain Loc --      Pain Education --      Exclude from Growth Chart --    No data found.  Updated Vital Signs BP 118/74 (BP Location: Left Arm)   Pulse 89   Temp 98.6 F (37 C) (Oral)   Resp 16   LMP 05/29/2023 (Approximate)   SpO2 97%   Breastfeeding  No   Visual Acuity Right Eye Distance:   Left Eye Distance:   Bilateral Distance:    Right Eye Near:   Left Eye Near:    Bilateral Near:     Physical Exam Vitals and nursing note reviewed.  Constitutional:      Appearance: Normal appearance.  HENT:     Head: Normocephalic and atraumatic.  Eyes:     Pupils: Pupils are equal, round, and reactive to light.  Cardiovascular:     Rate and Rhythm: Normal rate.  Pulmonary:     Effort: Pulmonary effort is normal.  Abdominal:     Tenderness: There is no right CVA tenderness or left CVA tenderness.  Genitourinary:    Pubic Area: No rash.        Comments: 2 small mildly tender papules to the right vaginal area as well as 1 to the left groin.  No swelling, fluctuance, redness or warmth.  No vesicles. Skin:    General: Skin is warm and dry.  Neurological:     General: No focal deficit present.     Mental Status: She is alert and oriented to person, place, and time.  Psychiatric:        Mood and Affect: Mood normal.        Behavior: Behavior normal.      UC Treatments / Results  Labs (all labs ordered are listed, but only abnormal results are displayed) Labs Reviewed  CERVICOVAGINAL ANCILLARY ONLY    EKG   Radiology No results found.  Procedures Procedures (including critical care time)  Medications Ordered in UC Medications - No data to display  Initial Impression / Assessment and Plan / UC Course  I have reviewed the triage vital signs and the nursing notes.  Pertinent labs & imaging results that were available during my care of the patient were reviewed by me and considered in my medical decision making (see chart for details).     Reviewed exam and symptoms with patient.  No red flags.  Vaginal swab is ordered and will contact for any positive results.  Will treat for BV based on history and symptoms.  She states  she has been on Flagyl multiple times will try clindamycin vaginal cream.  Diflucan to prevent  yeast infection.  Also start Bactrim for folliculitis.  Advised avoidance of shaving the area until symptoms completely resolved.  She is to follow-up with PCP or GYN if her symptoms do not improve.  ER precautions reviewed. Final Clinical Impressions(s) / UC Diagnoses   Final diagnoses:  Acute vaginitis  Folliculitis  Screening examination for STD (sexually transmitted disease)     Discharge Instructions      The clinic will contact you with results of the vaginal swab done today if positive.  Start clindamycin vaginal cream to treat BV.  You may take Diflucan to prevent yeast infection.  Also start Bactrim twice daily for 7 days to treat your folliculitis.  Avoid shaving the area until your symptoms completely resolved.  Please follow-up with your gynecologist if your symptoms do not improve.  Please go to the ER for any worsening symptoms.  I hope you feel better soon!    ED Prescriptions     Medication Sig Dispense Auth. Provider   clindamycin (CLEOCIN) 2 % vaginal cream Place 1 Applicatorful vaginally at bedtime. 40 g Radford Pax, NP   fluconazole (DIFLUCAN) 150 MG tablet Take 1 tablet (150 mg total) by mouth daily. Take 1 tablet on first day of antibiotics and then second tablet on fifth day of antibiotics 2 tablet Radford Pax, NP   sulfamethoxazole-trimethoprim (BACTRIM DS) 800-160 MG tablet Take 1 tablet by mouth 2 (two) times daily for 7 days. 14 tablet Radford Pax, NP      PDMP not reviewed this encounter.   Radford Pax, NP 06/07/23 [phone]

## 2023-06-07 NOTE — Discharge Instructions (Addendum)
The clinic will contact you with results of the vaginal swab done today if positive.  Start clindamycin vaginal cream to treat BV.  You may take Diflucan to prevent yeast infection.  Also start Bactrim twice daily for 7 days to treat your folliculitis.  Avoid shaving the area until your symptoms completely resolved.  Please follow-up with your gynecologist if your symptoms do not improve.  Please go to the ER for any worsening symptoms.  I hope you feel better soon!

## 2023-06-10 LAB — CERVICOVAGINAL ANCILLARY ONLY
Bacterial Vaginitis (gardnerella): POSITIVE — AB
Candida Glabrata: NEGATIVE
Candida Vaginitis: NEGATIVE
Chlamydia: NEGATIVE
Comment: NEGATIVE
Comment: NEGATIVE
Comment: NEGATIVE
Comment: NEGATIVE
Comment: NEGATIVE
Comment: NORMAL
Neisseria Gonorrhea: NEGATIVE
Trichomonas: NEGATIVE

## 2023-08-21 ENCOUNTER — Ambulatory Visit
Admission: RE | Admit: 2023-08-21 | Discharge: 2023-08-21 | Disposition: A | Discharge status: Home / Self Care | Source: Ambulatory Visit | Attending: Family Medicine | Admitting: Family Medicine

## 2023-08-21 VITALS — BP 137/81 | HR 75 | Temp 98.8°F | Resp 20

## 2023-08-21 DIAGNOSIS — N3001 Acute cystitis with hematuria: Secondary | ICD-10-CM | POA: Diagnosis present

## 2023-08-21 DIAGNOSIS — Z113 Encounter for screening for infections with a predominantly sexual mode of transmission: Secondary | ICD-10-CM | POA: Diagnosis present

## 2023-08-21 DIAGNOSIS — N898 Other specified noninflammatory disorders of vagina: Secondary | ICD-10-CM | POA: Insufficient documentation

## 2023-08-21 LAB — POCT URINALYSIS DIP (MANUAL ENTRY)
Bilirubin, UA: NEGATIVE
Glucose, UA: NEGATIVE mg/dL
Nitrite, UA: NEGATIVE
Protein Ur, POC: NEGATIVE mg/dL
Spec Grav, UA: 1.015 (ref 1.010–1.025)
Urobilinogen, UA: 0.2 U/dL
pH, UA: 6.5 (ref 5.0–8.0)

## 2023-08-21 LAB — POCT URINE PREGNANCY: Preg Test, Ur: NEGATIVE

## 2023-08-21 MED ORDER — METRONIDAZOLE 0.75 % VA GEL: 1.0000 | Freq: Every day | VAGINAL | 0 refills | Status: DC

## 2023-08-21 MED ORDER — CEPHALEXIN 500 MG PO CAPS
500.0000 mg | ORAL_CAPSULE | Freq: Two times a day (BID) | ORAL | 0 refills | Status: AC
Start: 2023-08-21 — End: 2023-08-28

## 2023-08-21 MED ORDER — FLUCONAZOLE 150 MG PO TABS
150.0000 mg | ORAL_TABLET | Freq: Every day | ORAL | 0 refills | Status: AC
Start: 2023-08-21 — End: ?

## 2023-08-21 NOTE — Discharge Instructions (Addendum)
 The clinic will contact you with results of the vaginal swab done today if positive.  Start MetroGel nightly for 7 nights.  Diflucan as prescribed.  The clinical send urine for culture and contact you with those results if positive.  Start Keflex twice daily for 7 days to help treat your UTI.  Please follow-up with your PCP or GYN if symptoms do not improve.  Please go to the ER if you develop any worsening symptoms.  Hope you feel better soon!

## 2023-08-21 NOTE — ED Provider Notes (Signed)
 [facility]    CSN: [id number] Arrival date & time: 08/21/23  1654      History   Chief Complaint Chief Complaint  Patient presents with   Vaginal Discharge    Would like a full panel! - Entered by patient    HPI Miranda Schneider is a 28 y.o. female presents for vaginal discharge.  Patient reports 5 days of a malodorous nonpruritic vaginal discharge.  Also endorses some suprapubic pressure but denies any dysuria, fevers, nausea/vomiting, flank pain.  No known STD exposure or concern but would like screening.  Does have a history of recurrent BV infections and states this feels similar.  She has been using over-the-counter boric acid without improvement.  No other concerns at this time.   Vaginal Discharge   Past Medical History:  Diagnosis Date   Vitamin D deficiency     Patient Active Problem List   Diagnosis Date Noted   Acute pansinusitis 11/01/2022   Acute pharyngitis 11/01/2022   Low serum progesterone 02/06/2022   History of termination of pregnancy 02/01/2022   Marijuana use 02/01/2022   Prenatal care, subsequent pregnancy 02/01/2022   Smoker 07/09/2019   Abdominal discomfort 03/06/2016   Vaginal discharge 03/06/2016   Shoulder subluxation, right 03/26/2011    History reviewed. No pertinent surgical history.  OB History     Gravida  3   Para      Term      Preterm      AB      Living         SAB      IAB      Ectopic      Multiple      Live Births               Home Medications    Prior to Admission medications   Medication Sig Start Date End Date Taking? Authorizing Provider  cephALEXin (KEFLEX) 500 MG capsule Take 1 capsule (500 mg total) by mouth 2 (two) times daily for 7 days. 08/21/23 08/28/23 Yes Radford Pax, NP  fluconazole (DIFLUCAN) 150 MG tablet Take 1 tablet (150 mg total) by mouth daily. Take 1 tablet on day 1 and on day 5 of antibiotic treatment 08/21/23  Yes Radford Pax, NP  metroNIDAZOLE (METROGEL) 0.75 %  vaginal gel Place 1 Applicatorful vaginally at bedtime for 7 days. 08/21/23 08/28/23 Yes Radford Pax, NP  benzonatate (TESSALON) 100 MG capsule Take 1 capsule (100 mg total) by mouth 3 (three) times daily as needed for cough. Patient not taking: Reported on 08/21/2023 04/01/23   Freddy Finner, NP  brompheniramine-pseudoephedrine-DM 30-2-10 MG/5ML syrup Take 5 mLs by mouth 4 (four) times daily as needed. Patient not taking: Reported on 08/21/2023 04/01/23   Freddy Finner, NP  omeprazole (PRILOSEC) 20 MG capsule Take 1 capsule (20 mg total) by mouth daily. 12/15/18 07/01/19  Georgetta Haber, NP    Family History Family History  Problem Relation Age of Onset   Diabetes Paternal Grandmother    Hypertension Paternal Grandmother    Diabetes Maternal Grandmother    Hypertension Paternal Grandfather    Heart disease Neg Hx    Stroke Neg Hx    Cancer Neg Hx     Social History Social History   Tobacco Use   Smoking status: Every Day    Types: Cigarettes   Smokeless tobacco: Never  Substance Use Topics   Alcohol use: Yes    Comment: occasionally  Drug use: No     Allergies   Patient has no known allergies.   Review of Systems Review of Systems  Genitourinary:  Positive for vaginal discharge.     Physical Exam Triage Vital Signs ED Triage Vitals  Encounter Vitals Group     BP 08/21/23 1715 137/81     Systolic BP Percentile --      Diastolic BP Percentile --      Pulse Rate 08/21/23 1715 75     Resp 08/21/23 1715 20     Temp 08/21/23 1712 98.8 F (37.1 C)     Temp src --      SpO2 08/21/23 1716 97 %     Weight --      Height --      Head Circumference --      Peak Flow --      Pain Score 08/21/23 1712 7     Pain Loc --      Pain Education --      Exclude from Growth Chart --    No data found.  Updated Vital Signs BP 137/81 (BP Location: Right Arm)   Pulse 75   Temp 98.8 F (37.1 C)   Resp 20   LMP 08/05/2023   SpO2 97%   Visual Acuity Right Eye  Distance:   Left Eye Distance:   Bilateral Distance:    Right Eye Near:   Left Eye Near:    Bilateral Near:     Physical Exam Vitals and nursing note reviewed.  Constitutional:      Appearance: Normal appearance.  HENT:     Head: Normocephalic and atraumatic.  Eyes:     Pupils: Pupils are equal, round, and reactive to light.  Cardiovascular:     Rate and Rhythm: Normal rate.  Pulmonary:     Effort: Pulmonary effort is normal.  Abdominal:     Tenderness: There is no right CVA tenderness or left CVA tenderness.  Skin:    General: Skin is warm and dry.  Neurological:     General: No focal deficit present.     Mental Status: She is alert and oriented to person, place, and time.  Psychiatric:        Mood and Affect: Mood normal.        Behavior: Behavior normal.      UC Treatments / Results  Labs (all labs ordered are listed, but only abnormal results are displayed) Labs Reviewed  POCT URINALYSIS DIP (MANUAL ENTRY) - Abnormal; Notable for the following components:      Result Value   Clarity, UA hazy (*)    Ketones, POC UA trace (5) (*)    Blood, UA trace-intact (*)    Leukocytes, UA Moderate (2+) (*)    All other components within normal limits  URINE CULTURE  RPR  HIV ANTIBODY (ROUTINE TESTING W REFLEX)  POCT URINE PREGNANCY  CERVICOVAGINAL ANCILLARY ONLY    EKG   Radiology No results found.  Procedures Procedures (including critical care time)  Medications Ordered in UC Medications - No data to display  Initial Impression / Assessment and Plan / UC Course  I have reviewed the triage vital signs and the nursing notes.  Pertinent labs & imaging results that were available during my care of the patient were reviewed by me and considered in my medical decision making (see chart for details).     Reviewed exam and symptoms with patient.  No red flags.  STD testing/vaginal  swab was ordered and will contact for any positive results.  Will start  metronidazole vaginal inserts and Diflucan as patient reports she always gets a yeast infection when she is treated for BV.  Additional treatment pending results.  UA positive for UTI, will culture and start Keflex twice daily for 7 days.  Advised PCP or GYN follow-up if symptoms do not improve.  ER precautions reviewed and patient verbalized understanding. Final Clinical Impressions(s) / UC Diagnoses   Final diagnoses:  Vaginal discharge  Screening examination for STD (sexually transmitted disease)  Acute cystitis with hematuria     Discharge Instructions      The clinic will contact you with results of the vaginal swab done today if positive.  Start MetroGel nightly for 7 nights.  Diflucan as prescribed.  The clinical send urine for culture and contact you with those results if positive.  Start Keflex twice daily for 7 days to help treat your UTI.  Please follow-up with your PCP or GYN if symptoms do not improve.  Please go to the ER if you develop any worsening symptoms.  Hope you feel better soon!     ED Prescriptions     Medication Sig Dispense Auth. Provider   fluconazole (DIFLUCAN) 150 MG tablet Take 1 tablet (150 mg total) by mouth daily. Take 1 tablet on day 1 and on day 5 of antibiotic treatment 2 tablet Radford Pax, NP   metroNIDAZOLE (METROGEL) 0.75 % vaginal gel Place 1 Applicatorful vaginally at bedtime for 7 days. 70 g Radford Pax, NP   cephALEXin (KEFLEX) 500 MG capsule Take 1 capsule (500 mg total) by mouth 2 (two) times daily for 7 days. 14 capsule Radford Pax, NP      PDMP not reviewed this encounter.   Radford Pax, NP 08/21/23 1755

## 2023-08-21 NOTE — ED Triage Notes (Signed)
 Pt c/o for vaginal discharge,itching,tenderness in the  groin since Saturday.Used boric acid a couple days ago.Taking tylenol

## 2023-08-22 ENCOUNTER — Telehealth: Payer: Self-pay

## 2023-08-22 DIAGNOSIS — N898 Other specified noninflammatory disorders of vagina: Secondary | ICD-10-CM

## 2023-08-22 LAB — URINE CULTURE: Culture: NO GROWTH

## 2023-08-22 LAB — RPR: RPR Ser Ql: NONREACTIVE

## 2023-08-22 LAB — HIV ANTIBODY (ROUTINE TESTING W REFLEX): HIV Screen 4th Generation wRfx: NONREACTIVE

## 2023-08-22 MED ORDER — METRONIDAZOLE 500 MG PO TABS
500.0000 mg | ORAL_TABLET | Freq: Two times a day (BID) | ORAL | 0 refills | Status: DC
Start: 2023-08-22 — End: 2023-09-20

## 2023-08-22 NOTE — Telephone Encounter (Signed)
 Pt called to request Metrogel be changed to oral because of the price.

## 2023-08-27 LAB — CERVICOVAGINAL ANCILLARY ONLY
Bacterial Vaginitis (gardnerella): POSITIVE — AB
Candida Glabrata: NEGATIVE
Candida Vaginitis: NEGATIVE
Chlamydia: NEGATIVE
Comment: NEGATIVE
Comment: NEGATIVE
Comment: NEGATIVE
Comment: NEGATIVE
Comment: NEGATIVE
Comment: NORMAL
Neisseria Gonorrhea: NEGATIVE
Trichomonas: NEGATIVE

## 2023-09-20 ENCOUNTER — Ambulatory Visit
Admission: RE | Admit: 2023-09-20 | Discharge: 2023-09-20 | Disposition: A | Discharge status: Home / Self Care | Source: Ambulatory Visit | Attending: Family Medicine | Admitting: Family Medicine

## 2023-09-20 VITALS — BP 127/81 | HR 79 | Temp 98.8°F | Resp 20

## 2023-09-20 DIAGNOSIS — N939 Abnormal uterine and vaginal bleeding, unspecified: Secondary | ICD-10-CM | POA: Insufficient documentation

## 2023-09-20 DIAGNOSIS — B9689 Other specified bacterial agents as the cause of diseases classified elsewhere: Secondary | ICD-10-CM | POA: Insufficient documentation

## 2023-09-20 DIAGNOSIS — N760 Acute vaginitis: Secondary | ICD-10-CM | POA: Insufficient documentation

## 2023-09-20 LAB — POCT URINALYSIS DIP (MANUAL ENTRY)
Bilirubin, UA: NEGATIVE
Glucose, UA: NEGATIVE mg/dL
Leukocytes, UA: NEGATIVE
Nitrite, UA: NEGATIVE
Protein Ur, POC: NEGATIVE mg/dL
Spec Grav, UA: 1.025
Urobilinogen, UA: 0.2 U/dL
pH, UA: 5.5

## 2023-09-20 LAB — POCT URINE PREGNANCY: Preg Test, Ur: NEGATIVE

## 2023-09-20 MED ORDER — METRONIDAZOLE 0.75 % VA GEL: 1.0000 | Freq: Every day | VAGINAL | 0 refills | Status: AC

## 2023-09-20 NOTE — ED Provider Notes (Signed)
 [facility]  Note:  This document was prepared using Conservation officer, historic buildings and may include unintentional dictation errors.  MRN: [id number] DOB: May 07, 1996  Subjective:   Miranda Schneider is a 28 y.o. female presenting for ~2-3 weeks of persistent vaginal bleeding. Had vaginal spotting before her cycle, followed by a week of heavy vaginal bleeding followed by vaginal spotting. Had a few days of no vaginal bleeding, now has a moderate amount of bleeding again. Has concerns about bacterial vaginosis now that she has longstanding history of this and noticed some thick malodorous brown discharge with her recent bleeding. Denies fever, n/v, abdominal pain, pelvic pain, rashes, dysuria, urinary frequency, hematuria.  Previously seen 08/21/2023, was treated for BV. Reports that she had difficulty tolerating oral medication and was not as compliant. Does better with Metrogel  but was too expensive last time. Cannot see her OB/GYN until June 10th.   No current facility-administered medications for this encounter.  Current Outpatient Medications:    benzonatate  (TESSALON ) 100 MG capsule, Take 1 capsule (100 mg total) by mouth 3 (three) times daily as needed for cough. (Patient not taking: Reported on 08/21/2023), Disp: 30 capsule, Rfl: 0   fluconazole  (DIFLUCAN ) 150 MG tablet, Take 1 tablet (150 mg total) by mouth daily. Take 1 tablet on day 1 and on day 5 of antibiotic treatment, Disp: 2 tablet, Rfl: 0   metroNIDAZOLE  (FLAGYL ) 500 MG tablet, Take 1 tablet (500 mg total) by mouth 2 (two) times daily., Disp: 14 tablet, Rfl: 0   No Known Allergies  Past Medical History:  Diagnosis Date   Vitamin D  deficiency      History reviewed. No pertinent surgical history.  Family History  Problem Relation Age of Onset   Diabetes Paternal Grandmother    Hypertension Paternal Grandmother    Diabetes Maternal Grandmother    Hypertension Paternal Grandfather    Heart disease  Neg Hx    Stroke Neg Hx    Cancer Neg Hx     Social History   Tobacco Use   Smoking status: Every Day    Types: Cigarettes   Smokeless tobacco: Never  Vaping Use   Vaping status: Never Used  Substance Use Topics   Alcohol use: Yes    Comment: occasionally   Drug use: No    ROS   Objective:   Vitals: BP 127/81 (BP Location: Right Arm)   Pulse 79   Temp 98.8 F (37.1 C) (Oral)   Resp 20   LMP 08/05/2023   SpO2 98%   Physical Exam Constitutional:      General: She is not in acute distress.    Appearance: Normal appearance. She is well-developed. She is not ill-appearing, toxic-appearing or diaphoretic.  HENT:     Head: Normocephalic and atraumatic.     Nose: Nose normal.     Mouth/Throat:     Mouth: Mucous membranes are moist.     Pharynx: Oropharynx is clear.  Eyes:     General: No scleral icterus.       Right eye: No discharge.        Left eye: No discharge.     Extraocular Movements: Extraocular movements intact.     Conjunctiva/sclera: Conjunctivae normal.  Cardiovascular:     Rate and Rhythm: Normal rate.  Pulmonary:     Effort: Pulmonary effort is normal.  Abdominal:     General: Bowel sounds are normal. There is no distension.     Palpations: Abdomen is  soft. There is no mass.     Tenderness: There is no abdominal tenderness. There is no right CVA tenderness, left CVA tenderness, guarding or rebound.  Skin:    General: Skin is warm and dry.  Neurological:     General: No focal deficit present.     Mental Status: She is alert and oriented to person, place, and time.  Psychiatric:        Mood and Affect: Mood normal.        Behavior: Behavior normal.        Thought Content: Thought content normal.        Judgment: Judgment normal.     Results for orders placed or performed during the hospital encounter of 09/20/23 (from the past 24 hours)  POCT urine pregnancy     Status: Normal   Collection Time: 09/20/23  5:16 PM  Result Value Ref Range    Preg Test, Ur Negative   POCT urinalysis dipstick     Status: Abnormal   Collection Time: 09/20/23  5:16 PM  Result Value Ref Range   Color, UA yellow    Clarity, UA clear    Glucose, UA negative mg/dL   Bilirubin, UA negative    Ketones, POC UA trace (5) (A) mg/dL   Spec Grav, UA 4.098    Blood, UA large (A)    pH, UA 5.5    Protein Ur, POC negative mg/dL   Urobilinogen, UA 0.2 E.U./dL   Nitrite, UA Negative    Leukocytes, UA Negative     Assessment and Plan :   PDMP not reviewed this encounter.  1. Bacterial vaginosis   2. Vaginal bleeding   3. Abnormal vaginal bleeding    Patient requested empiric treatment using MetroGel  for recurrent bacterial vaginosis.  I was agreeable to this.  Vaginal cytology pending.  Will defer use of progesterone medications for her abnormal vaginal bleeding.  Emphasized need to maintain her follow-up appointment with the gynecologist.  Counseled patient on potential for adverse effects with medications prescribed/recommended today, ER and return-to-clinic precautions discussed, patient verbalized understanding.    Adolph Hoop, [place] 09/20/23 1737

## 2023-09-20 NOTE — ED Triage Notes (Signed)
 Pt c/o vaginal bleeding x 15 days-stopped on 5/6 then started again last night-NAD-steady gait

## 2023-09-20 NOTE — Discharge Instructions (Signed)
 In your request we are treating empirically for bacterial vaginosis with MetroGel .  Please follow-up as soon as possible with your OB/GYN, keep your appointment with them.  We will let you know about your test results on Monday.

## 2023-09-23 LAB — CERVICOVAGINAL ANCILLARY ONLY
Bacterial Vaginitis (gardnerella): POSITIVE — AB
Candida Glabrata: NEGATIVE
Candida Vaginitis: NEGATIVE
Chlamydia: NEGATIVE
Comment: NEGATIVE
Comment: NEGATIVE
Comment: NEGATIVE
Comment: NEGATIVE
Comment: NEGATIVE
Comment: NORMAL
Neisseria Gonorrhea: NEGATIVE
Trichomonas: NEGATIVE

## 2024-02-11 ENCOUNTER — Ambulatory Visit

## 2024-02-14 ENCOUNTER — Ambulatory Visit
Admission: RE | Admit: 2024-02-14 | Discharge: 2024-02-14 | Disposition: A | Discharge status: Home / Self Care | Source: Ambulatory Visit | Attending: Family Medicine | Admitting: Family Medicine

## 2024-02-14 VITALS — BP 114/77 | HR 75 | Temp 98.5°F | Resp 16

## 2024-02-14 DIAGNOSIS — N898 Other specified noninflammatory disorders of vagina: Secondary | ICD-10-CM | POA: Diagnosis present

## 2024-02-14 DIAGNOSIS — Z113 Encounter for screening for infections with a predominantly sexual mode of transmission: Secondary | ICD-10-CM | POA: Diagnosis present

## 2024-02-14 LAB — POCT URINE DIPSTICK
Bilirubin, UA: NEGATIVE
Blood, UA: NEGATIVE
Glucose, UA: NEGATIVE mg/dL
Ketones, POC UA: NEGATIVE mg/dL
Leukocytes, UA: NEGATIVE
Nitrite, UA: NEGATIVE
POC PROTEIN,UA: NEGATIVE
Spec Grav, UA: 1.02 (ref 1.010–1.025)
Urobilinogen, UA: 1 U/dL
pH, UA: 7 (ref 5.0–8.0)

## 2024-02-14 LAB — POCT URINE PREGNANCY: Preg Test, Ur: NEGATIVE

## 2024-02-14 MED ORDER — CLINDAMYCIN HCL 300 MG PO CAPS: 300.0000 mg | ORAL_CAPSULE | Freq: Two times a day (BID) | ORAL | 0 refills | Status: AC

## 2024-02-14 NOTE — ED Provider Notes (Signed)
 [facility]    CSN: [id number] Arrival date & time: 02/14/24  1728      History   Chief Complaint Chief Complaint  Patient presents with   Abdominal Pain    Cramping the last 6 days & need to be screened for sti panel. - Entered by patient    HPI Miranda Schneider is a 28 y.o. female presents for vaginal discharge.  Patient reports 6 days of lower abdominal cramping with a malodorous vaginal discharge.  Reports some pressure with urination but denies dysuria, frequency, urgency, hematuria, nausea vomiting or flank pain. No abd pain.  No known STD exposure but would like screening.  States she does have a history of recurrent BV infections and was recently on a 16-month regimen with her GYN of metronidazole  gel twice a week as well as oral metronidazole  for 7 days once a month.  She finished this regimen last month.  She states it does feel like BV.  No other concerns at this time   Abdominal Pain Associated symptoms: vaginal discharge     Past Medical History:  Diagnosis Date   Vitamin D  deficiency     Patient Active Problem List   Diagnosis Date Noted   Acute pansinusitis 11/01/2022   Acute pharyngitis 11/01/2022   Low serum progesterone 02/06/2022   History of termination of pregnancy 02/01/2022   Marijuana use 02/01/2022   Prenatal care, subsequent pregnancy 02/01/2022   Smoker 07/09/2019   Abdominal discomfort 03/06/2016   Vaginal discharge 03/06/2016   Shoulder subluxation, right 03/26/2011    History reviewed. No pertinent surgical history.  OB History     Gravida  3   Para      Term      Preterm      AB      Living         SAB      IAB      Ectopic      Multiple      Live Births               Home Medications    Prior to Admission medications   Medication Sig Start Date End Date Taking? Authorizing Provider  clindamycin  (CLEOCIN ) 300 MG capsule Take 1 capsule (300 mg total) by mouth 2 (two) times daily for 7 days.  02/14/24 02/21/24 Yes Denya Buckingham, Jodi R, NP  benzonatate  (TESSALON ) 100 MG capsule Take 1 capsule (100 mg total) by mouth 3 (three) times daily as needed for cough. Patient not taking: Reported on 08/21/2023 04/01/23   Moishe Chiquita HERO, NP  fluconazole  (DIFLUCAN ) 150 MG tablet Take 1 tablet (150 mg total) by mouth daily. Take 1 tablet on day 1 and on day 5 of antibiotic treatment 08/21/23   Glendy Barsanti, Jodi R, NP  metroNIDAZOLE  (METROGEL ) 0.75 % vaginal gel Place 1 Applicatorful vaginally daily. Insert one applicator vaginally once daily. 09/20/23   Christopher Savannah, PA-C  omeprazole  (PRILOSEC) 20 MG capsule Take 1 capsule (20 mg total) by mouth daily. 12/15/18 07/01/19  Tellis Laneta NOVAK, NP    Family History Family History  Problem Relation Age of Onset   Diabetes Paternal Grandmother    Hypertension Paternal Grandmother    Diabetes Maternal Grandmother    Hypertension Paternal Grandfather    Heart disease Neg Hx    Stroke Neg Hx    Cancer Neg Hx     Social History Social History   Tobacco Use   Smoking status: Every Day  Types: Cigarettes   Smokeless tobacco: Never  Vaping Use   Vaping status: Never Used  Substance Use Topics   Alcohol use: Yes    Comment: occasionally   Drug use: No     Allergies   Patient has no known allergies.   Review of Systems Review of Systems  Genitourinary:  Positive for vaginal discharge.     Physical Exam Triage Vital Signs ED Triage Vitals  Encounter Vitals Group     BP 02/14/24 1740 114/77     Girls Systolic BP Percentile --      Girls Diastolic BP Percentile --      Boys Systolic BP Percentile --      Boys Diastolic BP Percentile --      Pulse Rate 02/14/24 1740 75     Resp 02/14/24 1740 16     Temp 02/14/24 1740 98.5 F (36.9 C)     Temp Source 02/14/24 1740 Oral     SpO2 02/14/24 1740 96 %     Weight --      Height --      Head Circumference --      Peak Flow --      Pain Score 02/14/24 1738 6     Pain Loc --      Pain Education --       Exclude from Growth Chart --    No data found.  Updated Vital Signs BP 114/77   Pulse 75   Temp 98.5 F (36.9 C) (Oral)   Resp 16   LMP 01/20/2024 (Exact Date)   SpO2 96%   Visual Acuity Right Eye Distance:   Left Eye Distance:   Bilateral Distance:    Right Eye Near:   Left Eye Near:    Bilateral Near:     Physical Exam Vitals and nursing note reviewed.  Constitutional:      Appearance: Normal appearance.  HENT:     Head: Normocephalic and atraumatic.  Eyes:     Pupils: Pupils are equal, round, and reactive to light.  Cardiovascular:     Rate and Rhythm: Normal rate.  Pulmonary:     Effort: Pulmonary effort is normal.  Skin:    General: Skin is warm and dry.  Neurological:     General: No focal deficit present.     Mental Status: She is alert and oriented to person, place, and time.  Psychiatric:        Mood and Affect: Mood normal.        Behavior: Behavior normal.      UC Treatments / Results  Labs (all labs ordered are listed, but only abnormal results are displayed) Labs Reviewed  RPR  HIV ANTIBODY (ROUTINE TESTING W REFLEX)  POCT URINE DIPSTICK  POCT URINE PREGNANCY  CERVICOVAGINAL ANCILLARY ONLY    EKG   Radiology No results found.  Procedures Procedures (including critical care time)  Medications Ordered in UC Medications - No data to display  Initial Impression / Assessment and Plan / UC Course  I have reviewed the triage vital signs and the nursing notes.  Pertinent labs & imaging results that were available during my care of the patient were reviewed by me and considered in my medical decision making (see chart for details).     Reviewed exam and symptoms with patient.  Vaginal swab/STD testing is ordered and will contact for any positive results.  Will treat for BV as she has been on metronidazole  frequently and recently wanted  clindamycin .  Advise probiotic while on this.  Advise GYN follow-up if symptoms do not improve.  ER  precautions reviewed Final Clinical Impressions(s) / UC Diagnoses   Final diagnoses:  Screen for STD (sexually transmitted disease)  Vaginal discharge     Discharge Instructions      Will contact you with results of the testing done today if positive.  Start clindamycin  twice daily for 7 days.  Take a probiotic while you are on this and/or eat yogurt as this can cause diarrhea.  Follow-up with your gynecologist if symptoms do not improve.  Please go to the ER for any worsening symptoms.  Hope you feel better soon!    ED Prescriptions     Medication Sig Dispense Auth. Provider   clindamycin  (CLEOCIN ) 300 MG capsule Take 1 capsule (300 mg total) by mouth 2 (two) times daily for 7 days. 14 capsule Yony Roulston, Jodi R, NP      PDMP not reviewed this encounter.   Loreda Myla SAUNDERS, NP 02/14/24 [phone]

## 2024-02-14 NOTE — ED Triage Notes (Signed)
 Pt present with lower abd pain x six days.   States she had a negative pregnancy test at home. C/o pressure when voiding. Denies dysuria and frequency.

## 2024-02-14 NOTE — Discharge Instructions (Addendum)
 Will contact you with results of the testing done today if positive.  Start clindamycin  twice daily for 7 days.  Take a probiotic while you are on this and/or eat yogurt as this can cause diarrhea.  Follow-up with your gynecologist if symptoms do not improve.  Please go to the ER for any worsening symptoms.  Hope you feel better soon!

## 2024-02-17 ENCOUNTER — Ambulatory Visit (HOSPITAL_COMMUNITY): Payer: Self-pay

## 2024-02-17 LAB — CERVICOVAGINAL ANCILLARY ONLY
Bacterial Vaginitis (gardnerella): NEGATIVE
Candida Glabrata: NEGATIVE
Candida Vaginitis: POSITIVE — AB
Chlamydia: NEGATIVE
Comment: NEGATIVE
Comment: NEGATIVE
Comment: NEGATIVE
Comment: NEGATIVE
Comment: NEGATIVE
Comment: NORMAL
Neisseria Gonorrhea: NEGATIVE
Trichomonas: NEGATIVE

## 2024-02-17 MED ORDER — FLUCONAZOLE 150 MG PO TABS: 150.0000 mg | ORAL_TABLET | Freq: Once | ORAL | 0 refills | Status: AC

## 2024-02-18 LAB — HIV ANTIBODY (ROUTINE TESTING W REFLEX): HIV Screen 4th Generation wRfx: NONREACTIVE

## 2024-02-18 LAB — RPR: RPR Ser Ql: NONREACTIVE

## 2024-02-25 ENCOUNTER — Ambulatory Visit (INDEPENDENT_AMBULATORY_CARE_PROVIDER_SITE_OTHER)

## 2024-02-25 ENCOUNTER — Ambulatory Visit: Payer: Self-pay | Admitting: Nurse Practitioner

## 2024-02-25 ENCOUNTER — Other Ambulatory Visit: Payer: Self-pay

## 2024-02-25 ENCOUNTER — Ambulatory Visit
Admission: RE | Admit: 2024-02-25 | Discharge: 2024-02-25 | Disposition: A | Discharge status: Home / Self Care | Source: Ambulatory Visit | Attending: Family Medicine | Admitting: Family Medicine

## 2024-02-25 VITALS — BP 116/84 | HR 98 | Temp 98.8°F | Resp 18

## 2024-02-25 DIAGNOSIS — J029 Acute pharyngitis, unspecified: Secondary | ICD-10-CM

## 2024-02-25 DIAGNOSIS — B349 Viral infection, unspecified: Secondary | ICD-10-CM | POA: Diagnosis not present

## 2024-02-25 DIAGNOSIS — R062 Wheezing: Secondary | ICD-10-CM

## 2024-02-25 DIAGNOSIS — R051 Acute cough: Secondary | ICD-10-CM

## 2024-02-25 LAB — POCT INFLUENZA A/B
Influenza A, POC: NEGATIVE
Influenza B, POC: NEGATIVE

## 2024-02-25 LAB — POC SOFIA SARS ANTIGEN FIA: SARS Coronavirus 2 Ag: NEGATIVE

## 2024-02-25 LAB — POCT RAPID STREP A (OFFICE): Rapid Strep A Screen: NEGATIVE

## 2024-02-25 MED ORDER — IPRATROPIUM-ALBUTEROL 0.5-2.5 (3) MG/3ML IN SOLN
3.0000 mL | Freq: Once | RESPIRATORY_TRACT | Status: AC
  Administered 2024-02-25: 3 mL via RESPIRATORY_TRACT

## 2024-02-25 MED ORDER — PROMETHAZINE-DM 6.25-15 MG/5ML PO SYRP: 5.0000 mL | ORAL_SOLUTION | Freq: Four times a day (QID) | ORAL | 0 refills | Status: AC | PRN

## 2024-02-25 MED ORDER — ALBUTEROL SULFATE HFA 108 (90 BASE) MCG/ACT IN AERS: 1.0000 | INHALATION_SPRAY | Freq: Four times a day (QID) | RESPIRATORY_TRACT | 0 refills | Status: AC | PRN

## 2024-02-25 MED ORDER — IPRATROPIUM BROMIDE 0.03 % NA SOLN: 2.0000 | Freq: Two times a day (BID) | NASAL | 0 refills | Status: AC

## 2024-02-25 NOTE — ED Provider Notes (Signed)
 [facility]    CSN: [id number] Arrival date & time: 02/25/24  1756      History   Chief Complaint Chief Complaint  Patient presents with   Sore Throat    Sore throat, runny nose, chills, fatigue, body aches. Covid, flu & strep test. - Entered by patient    HPI Miranda Schneider is a 28 y.o. female  presents for evaluation of URI symptoms for 2 days. Patient reports associated symptoms of cough, congestion, sore throat, body aches, shortness of breath and wheezing chills, fatigue, loss of taste and smell. Denies N/V/D. Patient does not have a hx of asthma. Patient is an active smoker.  She states last week she had similar symptoms and seem to go away or improve for couple days and then worsened 2 days ago.  Pt has taken cough medicine OTC for symptoms. Pt has no other concerns at this time.    Sore Throat Associated symptoms include shortness of breath.    Past Medical History:  Diagnosis Date   Vitamin D  deficiency     Patient Active Problem List   Diagnosis Date Noted   Acute pansinusitis 11/01/2022   Acute pharyngitis 11/01/2022   Low serum progesterone 02/06/2022   History of termination of pregnancy 02/01/2022   Marijuana use 02/01/2022   Prenatal care, subsequent pregnancy 02/01/2022   Smoker 07/09/2019   Abdominal discomfort 03/06/2016   Vaginal discharge 03/06/2016   Shoulder subluxation, right 03/26/2011    History reviewed. No pertinent surgical history.  OB History     Gravida  3   Para      Term      Preterm      AB      Living         SAB      IAB      Ectopic      Multiple      Live Births               Home Medications    Prior to Admission medications   Medication Sig Start Date End Date Taking? Authorizing Provider  albuterol (VENTOLIN HFA) 108 (90 Base) MCG/ACT inhaler Inhale 1-2 puffs into the lungs every 6 (six) hours as needed. 02/25/24  Yes Jamarco Zaldivar, Jodi R, NP  ipratropium (ATROVENT) 0.03 % nasal spray  Place 2 sprays into both nostrils every 12 (twelve) hours. 02/25/24  Yes Juniper Cobey, Jodi R, NP  promethazine -dextromethorphan (PROMETHAZINE -DM) 6.25-15 MG/5ML syrup Take 5 mLs by mouth 4 (four) times daily as needed for cough. 02/25/24  Yes Mekiah Wahler, Jodi R, NP  benzonatate  (TESSALON ) 100 MG capsule Take 1 capsule (100 mg total) by mouth 3 (three) times daily as needed for cough. Patient not taking: Reported on 08/21/2023 04/01/23   Moishe Chiquita HERO, NP  fluconazole  (DIFLUCAN ) 150 MG tablet Take 1 tablet (150 mg total) by mouth daily. Take 1 tablet on day 1 and on day 5 of antibiotic treatment 08/21/23   Kristi Hyer, Jodi R, NP  metroNIDAZOLE  (METROGEL ) 0.75 % vaginal gel Place 1 Applicatorful vaginally daily. Insert one applicator vaginally once daily. 09/20/23   Christopher Savannah, PA-C  omeprazole  (PRILOSEC) 20 MG capsule Take 1 capsule (20 mg total) by mouth daily. 12/15/18 07/01/19  Tellis Laneta NOVAK, NP    Family History Family History  Problem Relation Age of Onset   Diabetes Paternal Grandmother    Hypertension Paternal Grandmother    Diabetes Maternal Grandmother    Hypertension Paternal Grandfather    Heart  disease Neg Hx    Stroke Neg Hx    Cancer Neg Hx     Social History Social History   Tobacco Use   Smoking status: Every Day    Current packs/day: 0.50    Types: Cigarettes   Smokeless tobacco: Never  Vaping Use   Vaping status: Never Used  Substance Use Topics   Alcohol use: Yes    Comment: occasionally   Drug use: No     Allergies   Patient has no known allergies.   Review of Systems Review of Systems  Constitutional:  Positive for chills.  HENT:  Positive for congestion and sore throat.   Respiratory:  Positive for cough, shortness of breath and wheezing.   Musculoskeletal:  Positive for myalgias.     Physical Exam Triage Vital Signs ED Triage Vitals  Encounter Vitals Group     BP 02/25/24 1831 116/84     Girls Systolic BP Percentile --      Girls Diastolic BP Percentile --       Boys Systolic BP Percentile --      Boys Diastolic BP Percentile --      Pulse Rate 02/25/24 1831 98     Resp 02/25/24 1831 18     Temp 02/25/24 1831 98.8 F (37.1 C)     Temp Source 02/25/24 1831 Oral     SpO2 02/25/24 1831 95 %     Weight --      Height --      Head Circumference --      Peak Flow --      Pain Score 02/25/24 1829 9     Pain Loc --      Pain Education --      Exclude from Growth Chart --    No data found.  Updated Vital Signs BP 116/84   Pulse 98   Temp 98.8 F (37.1 C) (Oral)   Resp 18   LMP 01/20/2024 (Exact Date)   SpO2 95%   Visual Acuity Right Eye Distance:   Left Eye Distance:   Bilateral Distance:    Right Eye Near:   Left Eye Near:    Bilateral Near:     Physical Exam Vitals and nursing note reviewed.  Constitutional:      General: She is not in acute distress.    Appearance: She is well-developed. She is not ill-appearing.  HENT:     Head: Normocephalic and atraumatic.     Right Ear: Tympanic membrane and ear canal normal.     Left Ear: Tympanic membrane and ear canal normal.     Nose: Congestion present.     Mouth/Throat:     Mouth: Mucous membranes are moist.     Pharynx: Oropharynx is clear. Uvula midline. Posterior oropharyngeal erythema present.     Tonsils: No tonsillar exudate or tonsillar abscesses.  Eyes:     Conjunctiva/sclera: Conjunctivae normal.     Pupils: Pupils are equal, round, and reactive to light.  Cardiovascular:     Rate and Rhythm: Normal rate and regular rhythm.     Heart sounds: Normal heart sounds.  Pulmonary:     Effort: Pulmonary effort is normal.     Breath sounds: Wheezing present.     Comments: Expiratory wheezing left upper and lower lobe, resolved after nebulizer Musculoskeletal:     Cervical back: Normal range of motion and neck supple.  Lymphadenopathy:     Cervical: No cervical adenopathy.  Skin:  General: Skin is warm and dry.  Neurological:     General: No focal deficit present.      Mental Status: She is alert and oriented to person, place, and time.  Psychiatric:        Mood and Affect: Mood normal.        Behavior: Behavior normal.      UC Treatments / Results  Labs (all labs ordered are listed, but only abnormal results are displayed) Labs Reviewed  POC SOFIA SARS ANTIGEN FIA  POCT INFLUENZA A/B  POCT RAPID STREP A (OFFICE)    EKG   Radiology DG Chest 2 View Result Date: 02/25/2024 CLINICAL DATA:  Shortness of breath, cough, wheezing EXAM: DG CHEST 2V COMPARISON:  None Available. FINDINGS: The heart size and mediastinal contours are within normal limits. Both lungs are clear. The visualized skeletal structures are unremarkable. IMPRESSION: Normal study. Electronically Signed   By: Franky Crease M.D.   On: 02/25/2024 19:14    Procedures Procedures (including critical care time)  Medications Ordered in UC Medications  ipratropium-albuterol (DUONEB) 0.5-2.5 (3) MG/3ML nebulizer solution 3 mL (3 mLs Nebulization Given 02/25/24 1908)    Initial Impression / Assessment and Plan / UC Course  I have reviewed the triage vital signs and the nursing notes.  Pertinent labs & imaging results that were available during my care of the patient were reviewed by me and considered in my medical decision making (see chart for details).     Reviewed exam and symptoms with patient.  No red flags.  Negative strep, COVID, flu testing.  Chest x-ray negative.  Discussed viral illness and symptomatic treatment.  Albuterol inhaler as needed.  Promethazine  DM as needed for cough and Atrovent nasal spray as needed for congestion.  Encourage rest fluids and PCP follow-up if symptoms do not improve.  ER precautions reviewed Final Clinical Impressions(s) / UC Diagnoses   Final diagnoses:  Wheezing  Acute cough  Sore throat  Viral illness     Discharge Instructions      You tested negative for COVID, flu, and strep throat.  Your chest x-ray was negative for any  pneumonia. Please treat your symptoms with over the counter tylenol or ibuprofen , humidifier, and rest.  You may take Promethazine  DM as needed for your cough.  Please note this medication will make you drowsy.  Do not drink alcohol or drive while on this medication.  Albuterol inhaler as needed for wheezing or shortness of breath.  Atrovent nasal spray twice daily as needed for nasal congestion viral illnesses can last 7-14 days. Please follow up with your PCP if your symptoms are not improving. Please go to the ER for any worsening symptoms. This includes but is not limited to fever you can not control with tylenol or ibuprofen , you are not able to stay hydrated, you have shortness of breath or chest pain.  Thank you for choosing [facility] for your healthcare needs. I hope you feel better soon!      ED Prescriptions     Medication Sig Dispense Auth. Provider   albuterol (VENTOLIN HFA) 108 (90 Base) MCG/ACT inhaler Inhale 1-2 puffs into the lungs every 6 (six) hours as needed. 1 each Kristena Wilhelmi, Jodi R, NP   promethazine -dextromethorphan (PROMETHAZINE -DM) 6.25-15 MG/5ML syrup Take 5 mLs by mouth 4 (four) times daily as needed for cough. 118 mL Tysha Grismore, Jodi R, NP   ipratropium (ATROVENT) 0.03 % nasal spray Place 2 sprays into both nostrils every 12 (twelve) hours. 30  mL Shelbia Scinto, Jodi R, NP      PDMP not reviewed this encounter.   Loreda Myla SAUNDERS, NP 02/25/24 1935

## 2024-02-25 NOTE — Discharge Instructions (Addendum)
 You tested negative for COVID, flu, and strep throat.  Your chest x-ray was negative for any pneumonia. Please treat your symptoms with over the counter tylenol or ibuprofen , humidifier, and rest.  You may take Promethazine  DM as needed for your cough.  Please note this medication will make you drowsy.  Do not drink alcohol or drive while on this medication.  Albuterol inhaler as needed for wheezing or shortness of breath.  Atrovent nasal spray twice daily as needed for nasal congestion viral illnesses can last 7-14 days. Please follow up with your PCP if your symptoms are not improving. Please go to the ER for any worsening symptoms. This includes but is not limited to fever you can not control with tylenol or ibuprofen , you are not able to stay hydrated, you have shortness of breath or chest pain.  Thank you for choosing [facility] for your healthcare needs. I hope you feel better soon!

## 2024-02-25 NOTE — ED Triage Notes (Signed)
 Pt c/o chills, sore throat bodyaches, fatigue, nasal drainage, loss of taste and smellx2d.
